# Patient Record
Sex: Female | Born: 1960 | Race: White | Hispanic: No | Smoking: Never smoker
Health system: Southern US, Community
[De-identification: ages and names within clinical notes are randomized; demographics above are authoritative.]

## PROBLEM LIST (undated history)

## (undated) DIAGNOSIS — K219 Gastro-esophageal reflux disease without esophagitis: Secondary | ICD-10-CM

## (undated) DIAGNOSIS — I10 Essential (primary) hypertension: Secondary | ICD-10-CM

## (undated) DIAGNOSIS — F32A Depression, unspecified: Secondary | ICD-10-CM

## (undated) DIAGNOSIS — K449 Diaphragmatic hernia without obstruction or gangrene: Secondary | ICD-10-CM

## (undated) DIAGNOSIS — E785 Hyperlipidemia, unspecified: Secondary | ICD-10-CM

## (undated) DIAGNOSIS — F419 Anxiety disorder, unspecified: Secondary | ICD-10-CM

## (undated) DIAGNOSIS — F329 Major depressive disorder, single episode, unspecified: Secondary | ICD-10-CM

## (undated) DIAGNOSIS — K5790 Diverticulosis of intestine, part unspecified, without perforation or abscess without bleeding: Secondary | ICD-10-CM

## (undated) HISTORY — PX: NASAL SINUS SURGERY: SHX719

## (undated) HISTORY — PX: TOTAL ABDOMINAL HYSTERECTOMY: SHX209

---

## 1997-07-28 ENCOUNTER — Encounter: Admission: RE | Admit: 1997-07-28 | Discharge: 1997-07-28 | Payer: Self-pay | Admitting: Sports Medicine

## 1998-12-08 ENCOUNTER — Encounter (INDEPENDENT_AMBULATORY_CARE_PROVIDER_SITE_OTHER): Payer: Self-pay | Admitting: Specialist

## 1998-12-08 ENCOUNTER — Other Ambulatory Visit: Admission: RE | Admit: 1998-12-08 | Discharge: 1998-12-08 | Payer: Self-pay | Admitting: Obstetrics and Gynecology

## 1999-02-22 ENCOUNTER — Other Ambulatory Visit: Admission: RE | Admit: 1999-02-22 | Discharge: 1999-02-22 | Payer: Self-pay | Admitting: Obstetrics and Gynecology

## 1999-03-29 ENCOUNTER — Inpatient Hospital Stay (HOSPITAL_COMMUNITY): Admission: RE | Admit: 1999-03-29 | Discharge: 1999-03-31 | Payer: Self-pay | Admitting: Obstetrics and Gynecology

## 1999-03-29 ENCOUNTER — Encounter (INDEPENDENT_AMBULATORY_CARE_PROVIDER_SITE_OTHER): Payer: Self-pay | Admitting: Specialist

## 2000-08-28 ENCOUNTER — Other Ambulatory Visit: Admission: RE | Admit: 2000-08-28 | Discharge: 2000-08-28 | Payer: Self-pay | Admitting: Obstetrics and Gynecology

## 2001-09-10 ENCOUNTER — Other Ambulatory Visit: Admission: RE | Admit: 2001-09-10 | Discharge: 2001-09-10 | Payer: Self-pay | Admitting: Obstetrics and Gynecology

## 2002-11-04 ENCOUNTER — Other Ambulatory Visit: Admission: RE | Admit: 2002-11-04 | Discharge: 2002-11-04 | Payer: Self-pay | Admitting: Obstetrics and Gynecology

## 2003-11-17 ENCOUNTER — Other Ambulatory Visit: Admission: RE | Admit: 2003-11-17 | Discharge: 2003-11-17 | Payer: Self-pay | Admitting: Obstetrics and Gynecology

## 2004-11-15 ENCOUNTER — Encounter: Admission: RE | Admit: 2004-11-15 | Discharge: 2004-11-15 | Payer: Self-pay | Admitting: Family Medicine

## 2005-04-18 ENCOUNTER — Other Ambulatory Visit: Admission: RE | Admit: 2005-04-18 | Discharge: 2005-04-18 | Payer: Self-pay | Admitting: Obstetrics and Gynecology

## 2008-10-06 ENCOUNTER — Encounter: Admission: RE | Admit: 2008-10-06 | Discharge: 2008-10-06 | Payer: Self-pay | Admitting: Obstetrics & Gynecology

## 2009-05-19 ENCOUNTER — Encounter: Payer: Self-pay | Admitting: Internal Medicine

## 2009-05-21 ENCOUNTER — Encounter: Payer: Self-pay | Admitting: Internal Medicine

## 2009-05-25 ENCOUNTER — Encounter: Payer: Self-pay | Admitting: Internal Medicine

## 2009-06-01 ENCOUNTER — Encounter: Payer: Self-pay | Admitting: Internal Medicine

## 2009-06-08 ENCOUNTER — Encounter: Payer: Self-pay | Admitting: Internal Medicine

## 2009-06-09 ENCOUNTER — Encounter: Payer: Self-pay | Admitting: Internal Medicine

## 2009-06-11 ENCOUNTER — Encounter: Payer: Self-pay | Admitting: Internal Medicine

## 2009-06-15 ENCOUNTER — Encounter: Payer: Self-pay | Admitting: Internal Medicine

## 2009-06-22 ENCOUNTER — Encounter: Payer: Self-pay | Admitting: Internal Medicine

## 2009-06-25 ENCOUNTER — Encounter: Payer: Self-pay | Admitting: Internal Medicine

## 2009-11-02 ENCOUNTER — Encounter: Payer: Self-pay | Admitting: Internal Medicine

## 2009-11-09 ENCOUNTER — Encounter: Payer: Self-pay | Admitting: Internal Medicine

## 2009-12-07 ENCOUNTER — Encounter: Payer: Self-pay | Admitting: Internal Medicine

## 2009-12-21 ENCOUNTER — Encounter: Payer: Self-pay | Admitting: Internal Medicine

## 2009-12-22 ENCOUNTER — Encounter: Payer: Self-pay | Admitting: Internal Medicine

## 2009-12-23 ENCOUNTER — Emergency Department (HOSPITAL_COMMUNITY): Admission: EM | Admit: 2009-12-23 | Discharge: 2009-12-23 | Payer: Self-pay | Admitting: Emergency Medicine

## 2009-12-23 ENCOUNTER — Encounter: Payer: Self-pay | Admitting: Internal Medicine

## 2009-12-24 ENCOUNTER — Encounter: Payer: Self-pay | Admitting: Internal Medicine

## 2009-12-28 ENCOUNTER — Encounter: Payer: Self-pay | Admitting: Internal Medicine

## 2009-12-30 ENCOUNTER — Encounter: Payer: Self-pay | Admitting: Internal Medicine

## 2010-01-27 ENCOUNTER — Telehealth: Payer: Self-pay | Admitting: Internal Medicine

## 2010-01-27 ENCOUNTER — Encounter (INDEPENDENT_AMBULATORY_CARE_PROVIDER_SITE_OTHER): Payer: Self-pay | Admitting: *Deleted

## 2010-02-01 ENCOUNTER — Telehealth: Payer: Self-pay | Admitting: Internal Medicine

## 2010-02-22 ENCOUNTER — Ambulatory Visit: Payer: Self-pay | Admitting: Internal Medicine

## 2010-02-22 DIAGNOSIS — K625 Hemorrhage of anus and rectum: Secondary | ICD-10-CM

## 2010-02-22 DIAGNOSIS — R748 Abnormal levels of other serum enzymes: Secondary | ICD-10-CM | POA: Insufficient documentation

## 2010-02-22 DIAGNOSIS — K219 Gastro-esophageal reflux disease without esophagitis: Secondary | ICD-10-CM | POA: Insufficient documentation

## 2010-02-22 DIAGNOSIS — K5900 Constipation, unspecified: Secondary | ICD-10-CM | POA: Insufficient documentation

## 2010-02-22 DIAGNOSIS — K2270 Barrett's esophagus without dysplasia: Secondary | ICD-10-CM

## 2010-02-22 DIAGNOSIS — R1319 Other dysphagia: Secondary | ICD-10-CM

## 2010-02-22 DIAGNOSIS — R1012 Left upper quadrant pain: Secondary | ICD-10-CM

## 2010-02-25 ENCOUNTER — Ambulatory Visit (HOSPITAL_COMMUNITY)
Admission: RE | Admit: 2010-02-25 | Discharge: 2010-02-25 | Payer: Self-pay | Source: Home / Self Care | Attending: Internal Medicine | Admitting: Internal Medicine

## 2010-02-25 ENCOUNTER — Telehealth: Payer: Self-pay | Admitting: Internal Medicine

## 2010-03-01 ENCOUNTER — Encounter (INDEPENDENT_AMBULATORY_CARE_PROVIDER_SITE_OTHER): Payer: Self-pay | Admitting: *Deleted

## 2010-03-04 ENCOUNTER — Encounter (INDEPENDENT_AMBULATORY_CARE_PROVIDER_SITE_OTHER): Payer: Self-pay | Admitting: *Deleted

## 2010-03-08 ENCOUNTER — Ambulatory Visit: Payer: Self-pay | Admitting: Internal Medicine

## 2010-03-10 ENCOUNTER — Telehealth (INDEPENDENT_AMBULATORY_CARE_PROVIDER_SITE_OTHER): Payer: Self-pay | Admitting: *Deleted

## 2010-03-23 ENCOUNTER — Ambulatory Visit
Admission: RE | Admit: 2010-03-23 | Discharge: 2010-03-23 | Payer: Self-pay | Source: Home / Self Care | Attending: Internal Medicine | Admitting: Internal Medicine

## 2010-03-23 ENCOUNTER — Encounter: Payer: Self-pay | Admitting: Internal Medicine

## 2010-03-24 ENCOUNTER — Telehealth: Payer: Self-pay | Admitting: Internal Medicine

## 2010-03-26 ENCOUNTER — Telehealth: Payer: Self-pay | Admitting: Internal Medicine

## 2010-03-30 ENCOUNTER — Telehealth: Payer: Self-pay | Admitting: Internal Medicine

## 2010-04-06 ENCOUNTER — Ambulatory Visit (HOSPITAL_COMMUNITY)
Admission: RE | Admit: 2010-04-06 | Discharge: 2010-04-06 | Payer: Self-pay | Source: Home / Self Care | Attending: Internal Medicine | Admitting: Internal Medicine

## 2010-04-10 ENCOUNTER — Encounter: Payer: Self-pay | Admitting: Obstetrics and Gynecology

## 2010-04-19 ENCOUNTER — Ambulatory Visit
Admission: RE | Admit: 2010-04-19 | Discharge: 2010-04-19 | Payer: Self-pay | Source: Home / Self Care | Attending: Internal Medicine | Admitting: Internal Medicine

## 2010-04-19 DIAGNOSIS — K449 Diaphragmatic hernia without obstruction or gangrene: Secondary | ICD-10-CM | POA: Insufficient documentation

## 2010-04-19 DIAGNOSIS — K3184 Gastroparesis: Secondary | ICD-10-CM | POA: Insufficient documentation

## 2010-04-19 DIAGNOSIS — K5730 Diverticulosis of large intestine without perforation or abscess without bleeding: Secondary | ICD-10-CM | POA: Insufficient documentation

## 2010-04-20 ENCOUNTER — Telehealth: Payer: Self-pay | Admitting: Internal Medicine

## 2010-04-20 NOTE — Progress Notes (Signed)
Summary: New Patient Appt/Change of Practice  Phone Note Outgoing Call   Summary of Call: I spoke to pt and advised her we did find her records from [facility] and Dr. Carlean Purl will accept her as a patient.  I told the pt her records were on my desk when we had a water leak and everything on my desk was quickly picked up and placed in boxes.  Pt is understanding and thankful that we found her records.  She is scheduled for 03/17/10 @ 11am.  I advised pt she is scheduled a bit farther out than usual as we are scheduling her with extra time.  Pt is appreciative that we are going to spend extra time with her.  New patient letter printed and mailed to patient.  She will call in the interim if she begins to have any problems. Initial call taken by: Abelino Derrick CMA Deborra Medina),  January 27, 2010 9:21 AM

## 2010-04-20 NOTE — Progress Notes (Signed)
Summary: UGI shows HH, schedule EGD/colon  Phone Note Outgoing Call   Summary of Call: Let her know the UGI shows a hiatal hernia it is possible that is cause of her problems 1) EGD for LUQ pain and Barrett's 2) colonoscopy for rectal bleeding  am willing to do 12/14 afternoon if possible at [facility] otherwise looks like January unless she wants to separate the 2 and hold on colonoscopy (think EGD most important right now but will need a colonoscopy) Gatha Mayer MD, [facility]  February 25, 2010 5:53 PM   Follow-up for Phone Call        I spoke with pt.  She will check calendar and call me back regarding Wednesday.  Per Sharee Pimple at [facility]--OK to do procedure at 2:45P on 12/14. Abelino Derrick CMA Deborra Medina)  February 26, 2010 4:23 PM   RC from pt.  She will have procedures on 12/14.  This is set up at [facility].  She will come by office on 12/12 for instructions. Follow-up by: Abelino Derrick CMA Deborra Medina),  February 26, 2010 4:32 PM     Appended Document: UGI shows HH, schedule EGD/colon    Clinical Lists Changes  Medications: Added new medication of MOVIPREP 100 GM  SOLR (PEG-KCL-NACL-NASULF-NA ASC-C) As per prep instructions. - Signed Rx of MOVIPREP 100 GM  SOLR (PEG-KCL-NACL-NASULF-NA ASC-C) As per prep instructions.;  #1 x 0;  Signed;  Entered by: Abelino Derrick CMA (AAMA);  Authorized by: Gatha Mayer MD, [facility];  Method used: Electronically to [facility]*, [address] [place], [place]  [id number], Ph: [phone], Fax: [phone]    Prescriptions: MOVIPREP 100 GM  SOLR (PEG-KCL-NACL-NASULF-NA ASC-C) As per prep instructions.  #1 x 0   Entered by:   Abelino Derrick CMA ([place])   Authorized by:   Gatha Mayer MD, [facility]   Signed by:   Abelino Derrick CMA ([place]) on 02/26/2010   Method used:   Electronically to        [facility] (retail)       [place], [place]  [id number]       Ph: [phone]       Fax: [phone]   RxID:    [id number]

## 2010-04-20 NOTE — Progress Notes (Signed)
Summary: sooner appt  Phone Note Call from Patient Call back at Home Phone [phone] Call back at [phone]   Summary of Call: Message was left on my voice mail, pt would like to have a sooner appt.  She continues to have abd pain and GERD symptoms.  As we have not seen her in the office can we give her medical advice?  I have checked your schedule and there are no sooner appt times. Initial call taken by: Abelino Derrick CMA Deborra Medina),  February 01, 2010 5:24 PM  Follow-up for Phone Call        Ask Remo Lipps if we can open up some slots (my approval only - or you or Sheri) for afternoon Dec 5  looks like I need to work that PM Gatha Mayer MD, [facility]  February 01, 2010 6:45 PM  Remo Lipps will open some slots.  Pt is scheduled for 1:30pm, advised to arrive at 1:15pm.  Advised pt we could not give her specific advice, but she should continue managing her symptoms as she is.  If severe pain of fever to go to the ED.  pt voices understanding. Follow-up by: Abelino Derrick CMA Deborra Medina),  February 02, 2010 9:58 AM

## 2010-04-20 NOTE — Letter (Signed)
Summary: New Patient letter  [facility]  [address] [place], [place] [address]   Phone: [phone]  Fax: [phone]       01/27/2010 MRN: [id number]  Meagan Harmon [address] [place], [place]  [address]  Dear Meagan Harmon,  Welcome to the Gastroenterology Division at [facility].    You are scheduled to see Dr.  Carlean Purl on 03/17/10 at 11:00 AM on the 3rd floor at [facility] [place] [employer] [facility].  We ask that you try to arrive at our office 15 minutes prior to your appointment time to allow for check-in.  We would like you to complete the enclosed self-administered evaluation form prior to your visit and bring it with you on the day of your appointment.  We will review it with you.  Also, please bring a complete list of all your medications or, if you prefer, bring the medication bottles and we will list them.  Please bring your insurance card so that we may make a copy of it.  If your insurance requires a referral to see a specialist, please bring your referral form from your primary care physician.  Co-payments are due at the time of your visit and may be paid by cash, check or credit card.     Your office visit will consist of a consult with your physician (includes a physical exam), any laboratory testing he/she may order, scheduling of any necessary diagnostic testing (e.g. x-ray, ultrasound, CT-scan), and scheduling of a procedure (e.g. Endoscopy, Colonoscopy) if required.  Please allow enough time on your schedule to allow for any/all of these possibilities.    If you cannot keep your appointment, please call [phone] to cancel or reschedule prior to your appointment date.  This allows [place] the opportunity to schedule an appointment for another patient in need of care.  If you do not cancel or reschedule by 5 p.m. the business day prior to your appointment date, you will be charged a $50.00 late cancellation/no-show fee.    Thank you for  choosing [facility] for your medical needs.  We appreciate the opportunity to care for you.  Please visit [place] at our website  to learn more about our practice.                     Sincerely,                                                             The Gastroenterology Division

## 2010-04-20 NOTE — Assessment & Plan Note (Signed)
Summary: SEVERE INDIGESTION//OK TO SCHEDULE PER DR. GESSNER//SP   History of Present Illness Visit Type: new patient  Primary GI MD: Silvano Rusk MD [facility] Primary Provider: [facility]) Requesting Provider: na Chief Complaint: GERD History of Present Illness:   50 yo ww with approximately 2 year of indigestion problems. She was diagnosed with GERD and Barrett's esophagus after an EGD by Dr. Ferdinand Lango. Initially did well with therapy (PPI). However after that and 6 months she began to have LUQ pain and pain into the back. Tis ocures with or without eating. She stopped the PPI 's because they were ineffective and she lost confidence in GI care. Off x 8 weeks, using Alka Seltzer. She gets relief with this.  Pyrosis with retrosternal burning. She also had a nocturnal cough and choking. She will get hiccoughs when she eats and she stands up and belches and feels better but cannot eat anyoe (3 years of this). she may have to walk around to relieve it. Sometimes she vomits (projectile).  Some early satiety also. Bloated.  Amylase level slightly elevated and cardiologist ?ed possibility of pancreatitis  Also complaining of some painful defecation and slight rectal bleeding at times.    GI Review of Systems    Reports abdominal pain, acid reflux, belching, bloating, chest pain, dysphagia with solids, heartburn, loss of appetite, nausea, and  vomiting.     Location of  Abdominal pain: generalized.    Denies dysphagia with liquids, vomiting blood, weight loss, and  weight gain.      Reports change in bowel habits, constipation, diarrhea, rectal bleeding, and  rectal pain.     Denies anal fissure, black tarry stools, diverticulosis, fecal incontinence, heme positive stool, hemorrhoids, irritable bowel syndrome, jaundice, light color stool, and  liver problems. Preventive Screening-Counseling & Management  Alcohol-Tobacco     Alcohol drinks/day: 0     Smoking Status:  never  Caffeine-Diet-Exercise     Caffeine use/day: 5     Caffeine Counseling: decrease use of caffeine  Comments: she is reducing caffeine    HIDA Scan  Procedure date:  12/28/2009  Findings:      Normal with EF 43 %  [place] of Abdomen  Procedure date:  12/22/2009  Findings:      [place]  [place] of Abdomen  Procedure date:  06/09/2009  Findings:      Normal  EGD  Procedure date:  06/01/2009  Findings:      Barrett's esophagus Esophagitis  Dr. Ferdinand Lango   CT Abdomen/Pelvis  Procedure date:  06/25/2009  Findings:      Normal  [place]  CT Abdomen/Pelvis  Procedure date:  12/23/2009  Findings:        A small hiatal hernia with mild circumferential wall thickening in   the distal esophagus.  This may be related to esophagitis although   the entire extent of the abnormal esophagus is not visualized on   today's study and esophageal neoplasm would also be a   consideration.    Fluid is visible in the rectum, suggesting diarrhea.  No evidence   for colonic wall thickening to suggest colitis.   Current Medications (verified): 1)  Lisinopril-Hydrochlorothiazide 20-25 Mg Tabs (Lisinopril-Hydrochlorothiazide) .... Take 1 Tablet By Mouth Once A Day 2)  Zoloft 100 Mg Tabs (Sertraline Hcl) .... Take 2 Tablet By Mouth Daily 3)  Tylenol Extra Strength 500 Mg Tabs (Acetaminophen) .... Take 1 Tablet By Mouth As Needed 4)  Astepro 0.15 % Soln (Azelastine Hcl) .Marland KitchenMarland KitchenMarland Kitchen  As Needed 5)  Xyzal 5 Mg Tabs (Levocetirizine Dihydrochloride) .... Take 1 Tablet By Mouth Once A Day 6)  Alka-Seltzer Antacid 500 Mg Caps (Calcium Carbonate Antacid) .... As Needed 7)  Shaklee Herbal Laxative .[name].. 4 By Mouth Each Night  Allergies (verified): 1)  Sulfa  Past History:  Past Medical History: Depression GERD, ? Barrett's Hyperlipidemia Hypertension Anxiety Disorder Chronic Headaches  Past Surgical History: Hysterectomy Sinus Surgery   Family History: Family History  of Heart Disease: Mother No FH of Colon Cancer:  Social History: Dog Groomer Married 2 Children Patient has never smoked.  Illicit Drug Use - no Daily Caffeine Use: 5 daily  Alcohol Use - no Caffeine use/day:  5 Alcohol drinks/day:  0  Review of Systems       The patient complains of allergy/sinus, anxiety-new, arthritis/joint pain, back pain, change in vision, cough, depression-new, fatigue, headaches-new, night sweats, sleeping problems, and swelling of feet/legs.         All other ROS negative except as per HPI.   Vital Signs:  Patient profile:   50 year old female Height:      65 inches Weight:      151 pounds BMI:     25.22 BSA:     1.76 Pulse rate:   80 / minute Pulse rhythm:   regular BP sitting:   106 / 70  (left arm) Cuff size:   regular  Vitals Entered By: Hope Pigeon CMA (February 22, 2010 1:47 PM)  Physical Exam  General:  Well developed, well nourished, no acute distress. Head:  Normocephalic and atraumatic. Eyes:  PERRLA, no icterus. Mouth:  No deformity or lesions, dentition normal. Neck:  Supple; no masses or thyromegaly. Lungs:  Clear throughout to auscultation. Heart:  Regular rate and rhythm; no murmurs, rubs,  or bruits. Abdomen:  mild epigastric tenderness no masses, herniae, HSM BS+ no splash no bruits Rectal:  female staff present normal anoderm, small rectocele, brown stool, no masses ANOSCOPY: hemorhoids n anal canal Extremities:  no edema Skin:  numerous tattoos Cervical Nodes:  No significant cervical or supraclavicular adenopathy.  Psych:  Alert and cooperative. Normal mood and affect.   CMET, CBC, Lipase normal on 12/21/09 Amylase 144 high (22-80_ A1c = 5.5 Mag 1.9 TSH Normal 1.720 (11/09/09) Extensive office notes reviewed also as well as pulmonary and cardiac studies to be scanned into chart  Impression & Recommendations:  Problem # 1:  LUQ PAIN (ICD-789.02) Assessment New Cause not clear Objective data is esophagitis  and apparent Barrett's esophagus, hiatal hernia. ? if there is a motility disturbance or other functional pain. She is going to need a repeat EGD but Ba study first to asess anatomy and motility. Note PPI's (multiple) have not seemed to help, currently using alka Seltzer  Orders: UGI Series (UGI Series)  Problem # 2:  OTHER DYSPHAGIA (ICD-787.29) Assessment: New ?acahalasia or other motility disturbance   Orders: UGI Series (UGI Series)  Problem # 3:  GERD (ICD-530.81) Assessment: New Esophagitis at EGD and recent CT suggests also  Problem # 4:  BARRETTS ESOPHAGUS (ICD-530.85) will need an EGD by March  Problem # 5:  RECTAL BLEEDING (ICD-569.3) hemorhoids seen on anoscopy and pattern very consistent with this (only red blood on the tissue). to add fiber to herb laxatives  Problem # 6:  HYPERAMYLASEMIA (ICD-790.5) Assessment: New Mild elevations with normal lipase likely inconsequential ? macroamylasemia  Patient Instructions: 1)  You have an Upper GI Series scheduled for 02/25/10.  See seperate instructions. 2)  We will call you with further follow up after reviewing these results.  3)  Please begin taking Benefiber daily as prescribed below. 4)  Copy sent to : [facility] 5)  The medication list was reviewed and reconciled.  All changed / newly prescribed medications were explained.  A complete medication list was provided to the patient / caregiver.

## 2010-04-22 NOTE — Progress Notes (Signed)
Summary: biopsy results and plans  Phone Note Outgoing Call   Summary of Call: Let her know 1) Reflux esophagitis but no Barrett's esophagus seen this time - will need a repeat EGD within 1 year 2) proctitis - inflamed rectum - repeat routine colonoscopy x 1 year  3) If she is having constipation problems I recommend daily MiraLax 4) Hydrocortisone suppositoryt 25 milligrams per rectum nightly for 7 nights then as needed for rectal bleeding #21 no refills 5) schedule gastric emptying study re: refractory reflux disease 6) REV in February 7) continue with treatment plan and changes as per EGD note Gatha Mayer MD, [facility]  March 26, 2010 11:09 AM   Follow-up for Phone Call        Left message for patient to call back Patient is scheduled for GES 04/06/10 10:00 at [place] scheduled 05/04/10 8:30 RX to her pharmacy Barb Merino RN, [facility]  March 26, 2010 3:40 PM  Additional Follow-up for Phone Call Additional follow up Details #1::        repeat colonoscopy is 10 years Gatha Mayer MD, [facility]  March 26, 2010 3:58 PM     Additional Follow-up for Phone Call Additional follow up Details #2::    Left message for patient to call back Barb Merino RN, CGRN  March 26, 2010 5:05 PM  patient notified of all the above.  Recalls entered in [place] as indicated. Follow-up by: Barb Merino RN, CGRN,  March 29, 2010 10:50 AM  New/Updated Medications: ANUSOL-HC 25 MG SUPP (HYDROCORTISONE ACETATE) 1 pr q hs for 7 days Prescriptions: ANUSOL-HC 25 MG SUPP (HYDROCORTISONE ACETATE) 1 pr q hs for 7 days  #21 x 0   Entered by:   Barb Merino RN, CGRN   Authorized by:   Gatha Mayer MD, [facility]   Signed by:   Barb Merino RN, CGRN on 03/26/2010   Method used:   Electronically to        [facility]* (retail)       [address] [place], [place]  [id number]       Ph: [phone]       Fax: [phone]   RxID:   [id number]

## 2010-04-22 NOTE — Procedures (Signed)
Summary: Upper Endoscopy  Patient: Meagan Harmon Note: All result statuses are Final unless otherwise noted.  Tests: (1) Upper Endoscopy (EGD)   EGD Upper Endoscopy       [facility].     [place], [place]  [address]           ENDOSCOPY PROCEDURE REPORT           PATIENT:  Meagan Harmon, Meagan Harmon  MR#:  [id number]     BIRTHDATE:  [birth year], 49 yrs. old  GENDER:  female           ENDOSCOPIST:  Gatha Mayer, MD, [facility]           PROCEDURE DATE:  03/23/2010     PROCEDURE:  EGD with biopsy, [id number]     ASA CLASS:  Class II     INDICATIONS:  abdominal pain, left upper quadrant prior diagnosis     of Barrett's esophagus     also has heartburn/reflux           MEDICATIONS:   Fentanyl 50 mcg IV, Versed 7.5 mg IV     TOPICAL ANESTHETIC:  Exactacain Spray           DESCRIPTION OF PROCEDURE:   After the risks benefits and     alternatives of the procedure were thoroughly explained, informed     consent was obtained.  The [facility] [id number] endoscope was     introduced through the mouth and advanced to the second portion of     the duodenum, without limitations.  The instrument was slowly     withdrawn as the mucosa was fully examined.     <<PROCEDUREIMAGES>>           Multiple ulcers were found in the distal esophagus. Liner     ulcers/erosions 30-35 cm. Multiple biopsies were obtained and sent     to pathology.  A hiatal hernia was found. It was 5 cm in size.     35-40 cm.  Otherwise the examination was normal.    Retroflexed     views revealed a hiatal hernia.    The scope was then withdrawn     from the patient and the procedure completed.           COMPLICATIONS:  None           ENDOSCOPIC IMPRESSION:     1) Ulcers, multiple in the distal esophagus - consistent with     reflux esophagitis     2) 5 cm hiatal hernia     3) Otherwise normal examination           RECOMMENDATIONS:     1) REDUCE AND TRY TO STOP CAFFEINE     2) START RANITIDINE 300  MG TWICE A DAY (she reports that PPI     therapy was ineffective)     3) STOP ALKA SELTZER     4) WILL NOTIFY RE: PATHOLOGY AND NEXT STEP.           REPEAT EXAM:  In for EGD, pending biopsy results.           Gatha Mayer, MD, [facility]           CC:  The Patient           n.     eSIGNED:   Gatha Mayer at 03/23/2010 04:03 PM  [place], [place], [id number]  Note: An exclamation mark (!) indicates a result that was not dispersed into the flowsheet. Document Creation Date: 03/23/2010 4:04 PM _______________________________________________________________________  (1) Order result status: Final Collection or observation date-time: 03/23/2010 15:24 Requested date-time:  Receipt date-time:  Reported date-time:  Referring Physician:   Ordering Physician: Silvano Rusk [phone]) Specimen Source:  Source: Tawanna Cooler Order Number: [id number] Lab site:   Appended Document: Upper Endoscopy     Procedures Next Due Date:    EGD: 03/2011

## 2010-04-22 NOTE — Letter (Signed)
Summary: Pre Visit Letter Revised  [facility]  [place], [place] [address]   Phone: [phone]  Fax: [phone]        03/01/2010 MRN: [id number] Meagan Harmon [address] [place], [place]  [address]             Procedure Date:  March 23, 2010  Welcome to the Gastroenterology Division at [facility].    You are scheduled to see a nurse for your pre-procedure visit on March 08, 2010 at 8:00 am on the 3rd floor at [facility], [place] [employer].  We ask that you try to arrive at our office 15 minutes prior to your appointment time to allow for check-in.  Please take a minute to review the attached form.  If you answer "Yes" to one or more of the questions on the first page, we ask that you call the person listed at your earliest opportunity.  If you answer "No" to all of the questions, please complete the rest of the form and bring it to your appointment.    Your nurse visit will consist of discussing your medical and surgical history, your immediate family medical history, and your medications.   If you are unable to list all of your medications on the form, please bring the medication bottles to your appointment and we will list them.  We will need to be aware of both prescribed and over the counter drugs.  We will need to know exact dosage information as well.    Please be prepared to read and sign documents such as consent forms, a financial agreement, and acknowledgement forms.  If necessary, and with your consent, a friend or relative is welcome to sit-in on the nurse visit with you.  Please bring your insurance card so that we may make a copy of it.  If your insurance requires a referral to see a specialist, please bring your referral form from your primary care physician.  No co-pay is required for this nurse visit.     If you cannot keep your appointment, please call [phone] to cancel or reschedule prior to your appointment  date.  This allows [place] the opportunity to schedule an appointment for another patient in need of care.    Thank you for choosing [facility] for your medical needs.  We appreciate the opportunity to care for you.  Please visit [place] at our website  to learn more about our practice.  Sincerely, The Gastroenterology Division

## 2010-04-22 NOTE — Progress Notes (Signed)
Summary: Question about procedure  Phone Note Call from Patient Call back at Work Phone [phone]   Call For: Dr Carlean Purl Summary of Call: Having a ECL on 03-23-2010 and has a question about the procedure. Initial call taken by: Irwin Brakeman [facility],  March 10, 2010 9:48 AM  Follow-up for Phone Call        tried to reach pt. line busy x 2  Ulice Dash RN  March 10, 2010 10:00 AM  Pt called to tell us that she has a paper that says what type of anesthesia med she cannot take.  Her husband will bring paper to office on Friday 12/23.  Ulice Dash RN  March 10, 2010 10:31 AM

## 2010-04-22 NOTE — Letter (Signed)
Summary: [facility]   Imported By: Phillis Knack 03/01/2010 12:23:59  _____________________________________________________________________  External Attachment:    Type:   Image     Comment:   External Document

## 2010-04-22 NOTE — Letter (Signed)
Summary: [facility]   Imported By: Phillis Knack 03/01/2010 12:22:58  _____________________________________________________________________  External Attachment:    Type:   Image     Comment:   External Document

## 2010-04-22 NOTE — Progress Notes (Signed)
Summary: List of P.P.I. drugs.  Phone Note Call from Patient Call back at Work Phone [phone]   Caller: Patient Call For: Dr. Carlean Purl Reason for Call: Talk to Nurse Summary of Call: Wants to know if she can take OTC anti-acids for her acid reflux Initial call taken by: Webb Laws,  March 30, 2010 2:33 PM  Follow-up for Phone Call        Zantac is not controlling her heartburn she is requesting an alternate medication. Dr Carlean Purl please advise Follow-up by: Barb Merino RN, [place],  March 30, 2010 4:14 PM  Additional Follow-up for Phone Call Additional follow up Details #1::        we need to know every PPI she has taken and said di not work - my office note may have some listed but i would like her to try to list them, we may need to call them out to her when we find what is not on that list will try that one Additional Follow-up by: Gatha Mayer MD, Marval Regal,  March 31, 2010 6:15 AM    Additional Follow-up for Phone Call Additional follow up Details #2::    Pt. has previously tried Nexium and Prilosec once a day and was on Aciphex b.i.d. which she said helped  for awhile but then she  started getting breakthough  symptoms  even on b.i.d. dose  Follow-up by: Abel Presto RN,  March 31, 2010 9:30 AM  Additional Follow-up for Phone Call Additional follow up Details #3:: Details for Additional Follow-up Action Taken: see pantoprazole rx she can stop ranitidine on this Pt. ntfd. that Dr.Kanika Bungert sent new rx. to pharmacy and she is to stop Rantidine. Additional Follow-up by: Abel Presto RN,  March 31, 2010 2:00 PM  New/Updated Medications: PANTOPRAZOLE SODIUM 40 MG TBEC (PANTOPRAZOLE SODIUM) 1 by mouth two times a day take 30 minutes before breakfast and 30 minutes before supper Prescriptions: PANTOPRAZOLE SODIUM 40 MG TBEC (PANTOPRAZOLE SODIUM) 1 by mouth two times a day take 30 minutes before breakfast and 30 minutes before supper  #60 x 5   Entered and Authorized by:    Gatha Mayer MD, [facility]   Signed by:   Gatha Mayer MD, FACG on 03/31/2010   Method used:   Electronically to        [facility] (retail)       [address] [place], [place]  [id number]       Ph: [phone]       Fax: [phone]   RxID:   [id number]

## 2010-04-22 NOTE — Letter (Signed)
Summary: [facility] Instructions  [facility]  [place], [place] [address]   Phone: [phone]  Fax: [phone]       [name]    [birth year]    MRN: [id number]        Procedure Day /Date:  03/23/10  Tuesday     Arrival Time:  2pm      Procedure Time:  3pm     Location of Procedure:                    _x _  [place] (4th Floor)   [place]   Starting 5 days prior to your procedure 03/18/10  do not eat nuts, seeds, popcorn, corn, beans, peas,  salads, or any raw vegetables.  Do not take any fiber supplements (e.g. Metamucil, Citrucel, and Benefiber).  THE DAY BEFORE YOUR PROCEDURE         DATE:  03/22/10  DAY:   Monday  1.  Drink clear liquids the entire day-NO SOLID FOOD  2.  Do not drink anything colored red or purple.  Avoid juices with pulp.  No orange juice.  3.  Drink at least 64 oz. (8 glasses) of fluid/clear liquids during the day to prevent dehydration and help the prep work efficiently.  CLEAR LIQUIDS INCLUDE: Water Jello Ice Popsicles Tea (sugar ok, no milk/cream) Powdered fruit flavored drinks Coffee (sugar ok, no milk/cream) Gatorade Juice: apple, white grape, white cranberry  Lemonade Clear bullion, consomm, broth Carbonated beverages (any kind) Strained chicken noodle soup Hard Candy                             4.  In the morning, mix first dose of MoviPrep solution:    Empty 1 Pouch A and 1 Pouch B into the disposable container    Add lukewarm drinking water to the top line of the container. Mix to dissolve    Refrigerate (mixed solution should be used within 24 hrs)  5.  Begin drinking the prep at 5:00 p.m. The MoviPrep container is divided by 4 marks.   Every 15 minutes drink the solution down to the next mark (approximately 8 oz) until the full liter is complete.   6.  Follow completed prep with 16 oz of clear liquid of your choice (Nothing red or purple).   Continue to drink clear liquids until bedtime.  7.  Before going to bed, mix second dose of MoviPrep solution:    Empty 1 Pouch A and 1 Pouch B into the disposable container    Add lukewarm drinking water to the top line of the container. Mix to dissolve    Refrigerate  THE DAY OF YOUR PROCEDURE      DATE:   03/23/10    DAY:   Tuesday  Beginning at  10:00 a.m. (5 hours before procedure):         1. Every 15 minutes, drink the solution down to the next mark (approx 8 oz) until the full liter is complete.  2. Follow completed prep with 16 oz. of clear liquid of your choice.    3. You may drink clear liquids until  1:00pm  (2 HOURS BEFORE PROCEDURE).   MEDICATION INSTRUCTIONS  Unless otherwise instructed, you should take regular prescription medications with a small sip of water   as early as possible the morning of your procedure.  Additional medication instructions:  Hold Lisinopril/HCTZ the morning of procedure.         OTHER INSTRUCTIONS  You will need a responsible adult at least 49 years of age to accompany you and drive you home.   This person must remain in the waiting room during your procedure.  Wear loose fitting clothing that is easily removed.  Leave jewelry and other valuables at home.  However, you may wish to bring a book to read or  an iPod/MP3 player to listen to music as you wait for your procedure to start.  Remove all body piercing jewelry and leave at home.  Total time from sign-in until discharge is approximately 2-3 hours.  You should go home directly after your procedure and rest.  You can resume normal activities the  day after your procedure.  The day of your procedure you should not:   Drive   Make legal decisions   Operate machinery   Drink alcohol   Return to work  You will receive specific instructions about eating, activities and medications before you leave.    The above instructions have been reviewed and explained to  me by   Emerson Monte RN  March 08, 2010 8:32 AM    I fully understand and can verbalize these instructions _____________________________ Date _________

## 2010-04-22 NOTE — Letter (Signed)
Summary: [facility]   Imported By: Phillis Knack 03/01/2010 12:26:58  _____________________________________________________________________  External Attachment:    Type:   Image     Comment:   External Document

## 2010-04-22 NOTE — Procedures (Signed)
Summary: Colonoscopy  Patient: Meagan Harmon Note: All result statuses are Final unless otherwise noted.  Tests: (1) Colonoscopy (COL)   COL Colonoscopy           [facility]     [place], [place]  [id number]           COLONOSCOPY PROCEDURE REPORT           PATIENT:  Meagan, Harmon  MR#:  [id number]     BIRTHDATE:  09-28-1960, 49 yrs. old  GENDER:  female     ENDOSCOPIST:  Gatha Mayer, MD, [facility]           PROCEDURE DATE:  03/23/2010     PROCEDURE:  Colonoscopy with biopsy     ASA CLASS:  Class II     INDICATIONS:  rectal bleeding     MEDICATIONS:   There was residual sedation effect present from     prior procedure., Fentanyl 25 mcg, Versed 2.5 mg IV           DESCRIPTION OF PROCEDURE:   After the risks benefits and     alternatives of the procedure were thoroughly explained, informed     consent was obtained.  Digital rectal exam was performed and     revealed no abnormalities.   The LB 180AL [id number] endoscope was     introduced through the anus and advanced to the cecum, which was     identified by both the appendix and ileocecal valve, without     limitations.  The quality of the prep was excellent, using     MoviPrep.  The instrument was then slowly withdrawn as the colon     was fully examined. Insertion: 2:56 minutes Withdrawal: 7:10     minutes     <<PROCEDUREIMAGES>>           FINDINGS:  Moderate diverticulosis was found in the left colon.     Abnormal appearing mucosa in the rectum. Area of fissured and     irregular mucosa in distal rectum with mucoid discharge - ?     proctitis. Multiple biopsies were obtained and sent to pathology.     This was otherwise a normal examination of the colon.   Retroflexed     views in the rectum revealed no abnormalities.    The scope was     then withdrawn from the patient and the procedure completed.           COMPLICATIONS:  None     ENDOSCOPIC IMPRESSION:     1) Moderate diverticulosis  in the left colon     2) Abnormal mucosa in the rectum - ? proctitis     3) Otherwise normal examination     RECOMMENDATIONS:     1) Await biopsy results     REPEAT EXAM:  In for Colonoscopy, pending biopsy results.           Gatha Mayer, MD, [facility] Regal           CC:  The Patient           n.     eSIGNED:   Gatha Mayer at 03/23/2010 04:07 PM           Alla German, [id number]  Note: An exclamation mark (!) indicates a result that was not dispersed into the flowsheet. Document Creation Date: 03/23/2010 4:08 PM _______________________________________________________________________  (1) Order result  status: Final Collection or observation date-time: 03/23/2010 15:37 Requested date-time:  Receipt date-time:  Reported date-time:  Referring Physician:   Ordering Physician: Silvano Rusk [phone]) Specimen Source:  Source: Tawanna Cooler Order Number: [id number] Lab site:   Appended Document: Colonoscopy     Procedures Next Due Date:    Colonoscopy: 03/2020

## 2010-04-22 NOTE — Progress Notes (Signed)
Summary: Procedure ?s  Phone Note Call from Patient Call back at Home Phone [phone]   Caller: Patient Call For: Dr.Gessner Reason for Call: Talk to Nurse Summary of Call: Pt wants to speak with nurse, she has questions about the procedure that she had Initial call taken by: Martinique Johnson,  March 24, 2010 10:50 AM  Follow-up for Phone Call        Question answered as to if Dr. Carlean Purl will call her about an appointment.  Per EGD report, Dr. Carlean Purl will notrify her of bx results and next step in tx.  No further questions Follow-up by: Thurston Pounds RN II,  March 24, 2010 11:02 AM

## 2010-04-22 NOTE — Procedures (Signed)
Summary: Upper GI Endoscopy/Lenny Ferdinand Lango MD  Upper GI Katherine Mantle MD   Imported By: Phillis Knack 03/01/2010 12:05:12  _____________________________________________________________________  External Attachment:    Type:   Image     Comment:   External Document

## 2010-04-22 NOTE — Miscellaneous (Signed)
Summary: ranitidine rx  Clinical Lists Changes  Medications: Removed medication of MOVIPREP 100 GM  SOLR (PEG-KCL-NACL-NASULF-NA ASC-C) As per prep instructions. Added new medication of RANITIDINE HCL 300 MG  TABS (RANITIDINE HCL) 1 by mouth two times a day - Signed Rx of RANITIDINE HCL 300 MG  TABS (RANITIDINE HCL) 1 by mouth two times a day;  #60 x 1;  Signed;  Entered by: Gatha Mayer MD, [facility] Regal;  Authorized by: Gatha Mayer MD, [facility];  Method used: Electronically to [facility] [address], [place], [place]  [id number], Ph: [phone], Fax: [phone]    Prescriptions: RANITIDINE HCL 300 MG  TABS (RANITIDINE HCL) 1 by mouth two times a day  #60 x 1   Entered and Authorized by:   Gatha Mayer MD, [facility]   Signed by:   Gatha Mayer MD, FACG on 03/23/2010   Method used:   Electronically to        [facility] (retail)       [place], [place]  [id number]       Ph: [phone]       Fax: [phone]   RxID:   [id number]

## 2010-04-22 NOTE — Letter (Signed)
Summary: [facility]   Imported By: Phillis Knack 03/01/2010 12:26:00  _____________________________________________________________________  External Attachment:    Type:   Image     Comment:   External Document

## 2010-04-22 NOTE — Miscellaneous (Signed)
Summary: [facility] Previsit/prep  Clinical Lists Changes  Observations: Added new observation of ALLERGY REV: Done (03/08/2010 8:04)

## 2010-04-22 NOTE — Letter (Signed)
Summary: [facility]   Imported By: Phillis Knack 03/01/2010 12:25:05  _____________________________________________________________________  External Attachment:    Type:   Image     Comment:   External Document

## 2010-04-28 NOTE — Assessment & Plan Note (Signed)
Summary: Follow up EGD/GES/sheri   History of Present Illness Visit Type: Follow-up Visit Primary GI MD: Silvano Rusk MD [facility] Primary Provider: [facility]) Requesting Provider: na Chief Complaint: Acid reflux  History of Present Illness:   50 yo wm with GERD and now a new diagnosis of gastroparesis. Heartburn and reflux beter on pantoprazole. Still having abdominal pain in upper and mid abdomen. At night and sometimes during work. She has nausea "alot" but no vomiting. Sleep is distured by the symptoms.  She moves her bowels once daily or every other day with HerbLax. Fiber supplement stopped due to bloating.   GI Review of Systems    Reports acid reflux and  nausea.      Denies abdominal pain, belching, bloating, chest pain, dysphagia with liquids, dysphagia with solids, heartburn, loss of appetite, vomiting, vomiting blood, weight loss, and  weight gain.        Denies anal fissure, black tarry stools, change in bowel habit, constipation, diarrhea, diverticulosis, fecal incontinence, heme positive stool, hemorrhoids, irritable bowel syndrome, jaundice, light color stool, liver problems, rectal bleeding, and  rectal pain.    Current Medications (verified): 1)  Lisinopril-Hydrochlorothiazide 20-25 Mg Tabs (Lisinopril-Hydrochlorothiazide) .... Take 1 Tablet By Mouth Once A Day 2)  Zoloft 100 Mg Tabs (Sertraline Hcl) .... Take 2 Tablet By Mouth Daily 3)  Tylenol Extra Strength 500 Mg Tabs (Acetaminophen) .... Take 1 Tablet By Mouth As Needed 4)  Astepro 0.15 % Soln (Azelastine Hcl) .... As Needed 5)  Xyzal 5 Mg Tabs (Levocetirizine Dihydrochloride) .... Take 1 Tablet By Mouth Once A Day 6)  Alka-Seltzer Antacid 500 Mg Caps (Calcium Carbonate Antacid) .... As Needed 7)  Shaklee Herbal Laxative .[name].. 4 By Mouth Each Night 8)  Benefiber  Powd (Wheat Dextrin) .... Please Dissolve One Tablespoon or One Packet Into 8 Oz of Water Daily. 9)  Pantoprazole Sodium 40 Mg Tbec  (Pantoprazole Sodium) .[name].. 1 By Mouth Two Times A Day Take 30 Minutes Before Breakfast and 30 Minutes Before Supper 10)  Anusol-Hc 25 Mg Supp (Hydrocortisone Acetate) .... As Needed  Allergies (verified): 1)  Sulfa  Past History:  Past Medical History: Depression GERD, ? Barrett's Hyperlipidemia Hypertension Anxiety Disorder Chronic Headaches Hiatal Hernia Diverticulosis Gastroparesis   Past Surgical History: Reviewed history from 02/22/2010 and no changes required. Hysterectomy Sinus Surgery   Family History: Reviewed history from 02/22/2010 and no changes required. 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  Illicit Drug Use - no Daily Caffeine Use: 5 daily  Alcohol Use - no  Vital Signs:  Patient profile:   50 year old female Height:      65 inches Weight:      150 pounds BMI:     25.05 BSA:     1.75 Pulse rate:   74 / minute Pulse rhythm:   regular BP sitting:   100 / 68  (left arm) Cuff size:   regular  Vitals Entered By: Hope Pigeon CMA (April 19, 2010 9:28 AM)  Physical Exam  General:  Well developed, well nourished, no acute distress.   Impression & Recommendations:  Problem # 1:  GASTROPARESIS (ICD-536.3) Assessment New 66% gastric contents retained at 2 hours on recent gastric emptying study. This can explain her problems with reflux and possibly her LUQ pain. Will start metaclopramide as writen on med list. I  reviewed the possible neurologic side effects including tardive dyskinesia. Her husband was present. Plan is for a trial to see if efficacious and then determine if long-term Tx appropriate. May consider domperidone but that is less effective prokinetic.  Problem # 2:  GERD (ICD-530.81) Assessment: Improved two times a day pantoprazole is helping treatment of gastroparesis should  also help she is reducing caffeine and head of bed is elevated  Problem # 3:  LUQ PAIN (ICD-789.02) Assessment: Unchanged will see if tretment of gastroparesis elps  Problem # 4:  CONSTIPATION (ICD-564.00) Assessment: Improved Will try MiraLax and less stimulant laxatives, if possible fiber stopped due to bloating whih is not a surprise. constipation raises ? of mre diffuse motility disorder  Patient Instructions: 1)  Please pick up your medications at your pharmacy. 2)  Metaclopramide was prescribed, MiraLax recommended and placed on your medication list, and fiber removed. 3)  Copy sent to : [facility] 4)  Gastroparesis brochure given. Please follow the step 3 diet as best possible and eat more fequent and smaller meals. 5)  Please schedule a follow-up appointment in 6 to 8 weeks.  6)  If you think you are having side effects from the metaclopramide call back before your visit. 7)  The medication list was reviewed and reconciled.  All changed / newly prescribed medications were explained.  A complete medication list was provided to the patient / caregiver. Prescriptions: METOCLOPRAMIDE HCL 10 MG  TABS (METOCLOPRAMIDE HCL) 1/2 to 1 tablet 30 minutes before meals and at bedtime  #120 x 1   Entered and Authorized by:   Gatha Mayer MD, [facility]   Signed by:   Gatha Mayer MD, [facility] on 04/19/2010   Method used:   Electronically to        [facility] (retail)       [place], [place]  [id number]       Ph: [phone]       Fax: [phone]   RxID:   [id number]

## 2010-04-28 NOTE — Progress Notes (Signed)
Summary: speak to nurse  Phone Note Call from Patient Call back at Work Phone [phone]   Caller: Patient Call For: Carlean Purl Reason for Call: Talk to Nurse Summary of Call: Patient wants to speak to nurse  Initial call taken by: Ronalee Red,  April 20, 2010 2:45 PM  Follow-up for Phone Call        Patient reports the Metoclopramide as prescribed- AC&HS- is making her too tired. Patient wants to know if she can change to once daily, either at evening meal or bedtime? Shella Maxim RN  April 21, 2010 8:54 AM   Additional Follow-up for Phone Call Additional follow up Details #1::        take it at bedtime only and see what happens may also try 1/2 tablet twice a day Gatha Mayer MD, [facility]  April 21, 2010 8:57 AM     Additional Follow-up for Phone Call Additional follow up Details #2::    Lmom for patient to return my call. Shella Maxim RN  April 21, 2010 9:38 AM   Informed patient of Dr Celesta Aver recommendations. Patient will call for further questions or problems. I did not change the med administration- waiting to see how patient does. Follow-up by: Shella Maxim RN,  April 21, 2010 9:58 AM

## 2010-05-05 ENCOUNTER — Telehealth: Payer: Self-pay | Admitting: Internal Medicine

## 2010-05-12 NOTE — Progress Notes (Signed)
Summary: ? re meds  Phone Note Call from Patient Call back at Work Phone [phone]   Caller: Patient Call For: Dr Carlean Purl Reason for Call: Talk to Nurse Summary of Call: Patient wants to speak to nurse regarding reflux meds. Initial call taken by: Ronalee Red,  May 05, 2010 12:25 PM  Follow-up for Phone Call        Patient wants [place] to see if her estrogen medicine could be causing her reflux.  She does not know the name of the medication.  She will call back once she has the name of the medications.  She then said she would just wait and discuss this with him at her follow up.  I have asked her to call back with the name of the medication if we can help her. Follow-up by: Barb Merino RN, [place],  May 05, 2010 3:49 PM

## 2010-05-14 ENCOUNTER — Telehealth: Payer: Self-pay | Admitting: Internal Medicine

## 2010-05-18 NOTE — Progress Notes (Signed)
Summary: Triage  Phone Note Call from Patient Call back at Work Phone [phone]   Caller: Patient Call For: Dr. Carlean Purl Reason for Call: Talk to Nurse Summary of Call: Asking to speak directly to nurse. She woke up this morning and she is swollen all over her body Initial call taken by: Webb Laws,  May 14, 2010 8:54 AM  Follow-up for Phone Call        patient c/o swollen ankles and "everywhere". Patient is advied to contact her primary care for eval.  Patient verblaized understanding. Follow-up by: Barb Merino RN, [place],  May 14, 2010 10:38 AM  Additional Follow-up for Phone Call Additional follow up Details #1::       Additional Follow-up by: Gatha Mayer MD, [facility] Regal,  May 14, 2010 12:26 PM

## 2010-05-31 ENCOUNTER — Encounter: Payer: Self-pay | Admitting: Internal Medicine

## 2010-05-31 ENCOUNTER — Ambulatory Visit (INDEPENDENT_AMBULATORY_CARE_PROVIDER_SITE_OTHER): Payer: BC Managed Care – PPO | Admitting: Internal Medicine

## 2010-05-31 DIAGNOSIS — R142 Eructation: Secondary | ICD-10-CM

## 2010-05-31 DIAGNOSIS — R141 Gas pain: Secondary | ICD-10-CM

## 2010-05-31 DIAGNOSIS — R143 Flatulence: Secondary | ICD-10-CM

## 2010-05-31 DIAGNOSIS — K3184 Gastroparesis: Secondary | ICD-10-CM

## 2010-06-03 LAB — COMPREHENSIVE METABOLIC PANEL
AST: 38 U/L — ABNORMAL HIGH (ref 0–37)
Albumin: 3.9 g/dL (ref 3.5–5.2)
Calcium: 9.3 mg/dL (ref 8.4–10.5)
Creatinine, Ser: 0.79 mg/dL (ref 0.4–1.2)
GFR calc Af Amer: >60 mL/min (ref >60)
Total Protein: 8.1 g/dL (ref 6.0–8.3)

## 2010-06-03 LAB — CBC
MCH: 33.2 pg (ref 26.0–34.0)
MCHC: 35 g/dL (ref 30.0–36.0)
Platelets: 232 10*3/uL (ref 150–400)

## 2010-06-03 LAB — DIFFERENTIAL
Eosinophils Relative: 6 % — ABNORMAL HIGH (ref 0–5)
Lymphocytes Relative: 30 % (ref 12–46)
Lymphs Abs: 1.9 10*3/uL (ref 0.7–4.0)
Monocytes Absolute: 0.5 10*3/uL (ref 0.1–1.0)
Monocytes Relative: 9 % (ref 3–12)

## 2010-06-03 LAB — LIPASE, BLOOD: Lipase: 66 U/L — ABNORMAL HIGH (ref 11–59)

## 2010-06-08 NOTE — Assessment & Plan Note (Signed)
Summary: 6-8 WEEK FU   Vital Signs:  Patient profile:   50 year old female Height:      65 inches Weight:      157 pounds BMI:     26.22 Pulse rate:   88 / minute Pulse rhythm:   regular BP sitting:   108 / 70  (left arm)  Vitals Entered By: Randye Lobo NCMA (May 31, 2010 10:45 AM)  History of Present Illness Visit Type: Follow-up Visit Primary GI MD: Silvano Rusk MD [facility] Primary Provider: [facility]) Requesting Provider: na Chief Complaint: 6-8 week follow-up.  Pt. still c/o acid reflux and bloating.   History of Present Illness:   50 yo ww with gastroparesis and GERD. She started metaclopramide 10 mg nd is taking two times a day. Following gas diet, avoiding foods on the list. She has had no improvement. she is tired on that and that is why she is only taking twice day. She had fatigue prior to that treatment tough. She remains bloated with heartburn, abdominal pain and bloated. Moving her bowels with "Herblax", using every other day but it was better on daily use. MiraLax did not promote defecation at once a day. She called [place] about "sweling all over" and saw cardiologist, was treated with diuretics sccessfully and is awaiting test results. Echocardiogram was performed.    GI Review of Systems    Reports bloating and  weight gain.      Denies abdominal pain, acid reflux, belching, chest pain, dysphagia with liquids, dysphagia with solids, heartburn, loss of appetite, nausea, vomiting, vomiting blood, and  weight loss.      Reports change in bowel habits and  constipation.     Denies anal fissure, black tarry stools, diarrhea, diverticulosis, fecal incontinence, heme positive stool, hemorrhoids, irritable bowel syndrome, jaundice, light color stool, liver problems, rectal bleeding, and  rectal pain.  EGD  Procedure date:  03/23/2010  Findings:        1) Ulcers, multiple in the distal esophagus - consistent with     reflux esophagitis     2) 5  cm hiatal hernia     3) Otherwise normal examination   - INFLAMED SQUAMOUS MUCOSA AND DETACHED FRAGMENTS INFLAMMATORY EXUDATE. - NO EVIDENCE FUNGAL ORGANISM OR VIRAL CYTOPATHIC CHANGES. - NO EVIDENCE OF EOSINOPHILIC ESOPHAGITIS. - NO EVIDENCE OF INTESTINAL METAPLASIA, DYSPLASIA OR MALIGNANCY.   Current Medications (verified): 1)  Lisinopril-Hydrochlorothiazide 20-25 Mg Tabs (Lisinopril-Hydrochlorothiazide) .... Take 1 Tablet By Mouth Once A Day 2)  Zoloft 100 Mg Tabs (Sertraline Hcl) .... Take 2 Tablet By Mouth Daily 3)  Tylenol Extra Strength 500 Mg Tabs (Acetaminophen) .... Take 1 Tablet By Mouth As Needed 4)  Astepro 0.15 % Soln (Azelastine Hcl) .... As Needed 5)  Xyzal 5 Mg Tabs (Levocetirizine Dihydrochloride) .... Take 1 Tablet By Mouth Once A Day 6)  Shaklee Herbal Laxative .[name].. 4 By Mouth Each Night As Needed For Constipation (Pt. Has Been Taking It Every Other Day)  Will Resume Once Daily 7)  Pantoprazole Sodium 40 Mg Tbec (Pantoprazole Sodium) .[name].. 1 By Mouth Two Times A Day Take 30 Minutes Before Breakfast and 30 Minutes Before Supper 8)  Metoclopramide Hcl 10 Mg  Tabs (Metoclopramide Hcl) .... 1/2 To 1 Tablet 30 Minutes Before Meals and At Bedtime 9)  Estro .[name].. 1 By Mouth Once Daily  Allergies (verified): 1)  Sulfa  Past History:  Past Medical History: Reviewed history from 04/19/2010 and no changes required. Depression GERD, ?  Barrett's Hyperlipidemia Hypertension Anxiety Disorder Chronic Headaches Hiatal Hernia Diverticulosis Gastroparesis   Past Surgical History: Reviewed history from 02/22/2010 and no changes required. Hysterectomy Sinus Surgery   Family History: Reviewed history from 02/22/2010 and no changes required. 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  Illicit Drug Use - no Daily  Caffeine Use: 5 daily  Alcohol Use - no  Physical Exam  General:  Well developed, well nourished, no acute distress. Heart:  Regular rate and rhythm; no murmurs, rubs,  or bruits. Abdomen:  nontender no masses, herniae, HSM BS+ no splash no bruits   Impression & Recommendations:  Problem # 1:  GASTROPARESIS (ICD-536.3) Assessment Unchanged She was not helped by metaclopramide so will dc and try domperidone. Not as efficacious but may tolerate better as she had fatigue and somnolence on metaclopramide. if this fails to achieve success then a tertiary referral for motility disturbance likely.  Problem # 2:  FLATULENCE ERUCTATION AND GAS PAIN (ICD-787.3) Assessment: New trial of metronidazole to treat possible small bowel bacterial overgrowth. some of her pain is likely neuropathic.  Problem # 3:  GERD (ICD-530.81) Assessment: Unchanged Need to improve gastric emptying to help this, I believe. continue PPI.  Patient Instructions: 1)  Stop metaclopramide. 2)  [facility] your [facility] for Domperidone and mailing information.  3)  Your prescription for Flagyl has been sent to [facility]  4)  Copy sent to : Ted Mcalpine, MD [facility]) 5)  The medication list was reviewed and reconciled.  All changed / newly prescribed medications were explained.  A complete medication list was provided to the patient / caregiver.

## 2010-06-11 ENCOUNTER — Telehealth: Payer: Self-pay | Admitting: Internal Medicine

## 2010-06-11 DIAGNOSIS — K3184 Gastroparesis: Secondary | ICD-10-CM

## 2010-06-11 NOTE — Telephone Encounter (Signed)
Pt feels Domperidone is not helping.  She has been taking it for about 1 week.  Having a lot of abdominal pain and cramping along with vomiting last night.  Per your last office note may need tertiary referral.  She is wondering if she should go back on Reglan.  Please advise.

## 2010-06-11 NOTE — Telephone Encounter (Signed)
I spoke with the patient she is aware of Dr Celesta Aver recommendations.  Dr Derrill Kay scheduled is not available at this time, they have asked that I fax records and they will call me back with an appt date and time. Patient aware.  Notes faxed to attention Albina Billet at [phone].  She is aware it is ok to restart reglan.

## 2010-06-11 NOTE — Telephone Encounter (Signed)
She can retry the reglan - initiate referral to Dr. Derrill Kay at [facility] re: gastroparesis

## 2010-06-17 ENCOUNTER — Telehealth: Payer: Self-pay | Admitting: Internal Medicine

## 2010-06-17 NOTE — Telephone Encounter (Signed)
I called and spoke with [facility] they have the patient's records and are still in for review.  They will call me with an appt date and time once the records have been reviewed by Dr Derrill Kay.

## 2010-06-17 NOTE — Telephone Encounter (Signed)
I did speak with the patient and she is aware that the records are at [facility] for review and we will call her as soon as we know the date and time of the appointment.

## 2010-06-29 ENCOUNTER — Telehealth: Payer: Self-pay | Admitting: Internal Medicine

## 2010-06-29 NOTE — Telephone Encounter (Signed)
Patient advised that I have been in contact with [facility] and they are still reviewing her records, we will call her with the appointment when available.

## 2010-07-06 ENCOUNTER — Telehealth: Payer: Self-pay | Admitting: Internal Medicine

## 2010-07-06 MED ORDER — DEXLANSOPRAZOLE 60 MG PO CPDR: 60.0000 mg | DELAYED_RELEASE_CAPSULE | Freq: Every day | ORAL | Status: DC

## 2010-07-06 NOTE — Telephone Encounter (Signed)
She may try Dexilant 60 mg daily samples instead of pantoprazole bid. Her insurance may not cover it so long-term use could be problematioc. 2 week trial reasonable.  I do not know status of [facility] appt will need to check that. It does take a long time but I think they have the expertise there.

## 2010-07-06 NOTE — Telephone Encounter (Signed)
Patient calling to see if her appointment at [facility] is scheduled yet. She is interested in knowing if she can go somewhere else since it is taking them so long to get her an appointment. Also, she wants to know if Dr. Roel Cluck Dexilant might help her. Please, advise.

## 2010-07-06 NOTE — Telephone Encounter (Signed)
Patient given Dr. Celesta Aver recommendations. She wants to try the Dexilant instead of Pantoprazole. Samples up front for pick up.

## 2010-07-12 ENCOUNTER — Other Ambulatory Visit: Payer: Self-pay | Admitting: Internal Medicine

## 2010-07-12 DIAGNOSIS — K3184 Gastroparesis: Secondary | ICD-10-CM

## 2010-07-12 NOTE — Progress Notes (Signed)
Patient advised of appt date and time for 08/18/10 11:00 with Dr Derrill Kay

## 2010-08-06 NOTE — Op Note (Signed)
[facility]  Patient:    Meagan Harmon                      MRN: [id number] Proc. Date: 03/29/99 Adm. Date:  [id number] Attending:  Courtney Heys                           Operative Report  PREOPERATIVE DIAGNOSIS:       Abnormal uterine bleeding.  POSTOPERATIVE DIAGNOSIS:      Abnormal uterine bleeding.  Endometriosis.  OPERATION:                    Laparoscopically-assisted vaginal hysterectomy.  SURGEON:                      Darlyn Chamber, M.D.  ASSISTANT:                    Jerald Kief, M.D.  ANESTHESIA:                   General endotracheal anesthesia.  ESTIMATED BLOOD LOSS:         200 to 300 cc.  PACKS AND DRAINS:             None.  BLOOD REPLACED:               None.  COMPLICATIONS:                None.  INDICATIONS:                  As dictated in the history and physical.  DESCRIPTION OF PROCEDURE:     The patient was taken to the OR and placed in the  supine position. After a satisfactory level of general endotracheal anesthesia as obtained, the patient was placed in the dorsal lithotomy position using the Allen stirrups.  The abdomen, perineum, and vagina were prepped out with Betadine. The bladder was emptied by in-and-out catheterization.  Examination under anesthesia revealed the uterus to be midposition, normal size, and shape, adnexa unremarkable. Hulka tenaculum was put in place and secured and the patient was draped out for  laparoscopy.  A subumbilical incision was made with the knife.  The attempts to  insert the Veress needle were unsuccessful.  We continued to get high pressures. Therefore, we proceeded with open laparoscopy.  The fascia was identified and entered sharply.  The incision to the fascia was extended laterally.  Muscles were manually separated in the midline.  The peritoneum was manually entered.  There was no evidence of adhesions to the periumbilical area.  Two lateral sutures  of 0 Vicryl were put in place and held and the Hasson cannula was put in place and secured with the held sutures.  The abdomen was inflated with the carbon dioxide. Close visualization revealed no evidence of bowel injury from the Veress needle and there was no vascular injury that we could tell of.  A 5 mm trocar was put in place under direct visualization.  The uterus was upper limits of normal size with slight irregularities.  There were areas of endometriosis in the cul-de-sac area. Both ovaries and tubes were unremarkable.  Both round ligaments were cauterized using the Bovie and incised.  Both tubes including mesosalpinx were cauterized and incised.  Finally both utero-ovarian ligaments were cauterized and incised. This completely separated the adnexa on  both sides.  Cautery was continued until resistance read 0 each time and we did have good hemostasis bilaterally.  At this point in time, the attention was to proceed vaginally.  The abdomen was deflated of its carbon dioxide and the laparoscope was removed.  The patients legs were repositioned and the weighted speculum was placed in the  vaginal vault.  The Hulka tenaculum was removed.  The cervix was grasped with a  Ardis Hughs tenaculum.  The cul-de-sac was entered sharply.  Both uterosacral ligaments were clamped, cut, and suture ligated with 0 Vicryl.  The reflection of the vaginal mucosa anteriorly was incised and the bladder dissected superiorly.  The paracervical tissue was clamped, cut, and suture ligated with 0 Vicryl.  The vesicouterine space was entered sharply and retractor was put in place to retract the bladder superiorly.  The uterine vessels were then clamped, cut, and suture  ligated with 0 Vicryl.  Next, using the clamp, cut, and tie technique with suture ligatures of 0 Vicryl, the parametrium was serially separated from the sides of the uterus.  The uterus was then flipped.  The remaining pedicles were  clamped and ut and the uterus was passed off the operative field and held.  The vascular pedicles were ligated with a free tie of 0 Vicryl.  The posterior cuff was then run with a running locking suture of 0 Vicryl.  The vaginal mucosa was reapproximated in the midline with interrupted figure-of-eight sutures of 0 Vicryl.  The Foley was placed to straight drain to achieve an adequate amount of clear urine.  A sponge on a spongestick was placed in the vaginal vault for manipulation.  Attention was now turned back to the umbilical area.  The legs were repositioned and abdomen was inflated with carbon dioxide.  The laparoscope was reintroduced for visualization.  Visualization of the cuff revealed no active bleeding.  Both ovarian pedicles were hemostatically intact.  Revisualization of the abdominal rea again no evidence of injury to adjacent organs.  The appendix was retrocecal and unremarkable.  The abdomen was deflated of its carbon dioxide.  All trocars were removed.  Subumbilical fascia was closed with a figure-of-eight of 0 Vicryl. The skin was closed with a running subcuticular of 4-0 Vicryl.  The suprapubic incision was closed with Steri-Strips.  Sponge on a spongestick was taken out of the vaginal vault.  The patients legs were taken down.  Once extubated, the patient was transferred to the recovery room in good condition.  Sponge, needle, and instrument counts were correct by circulating nurse x 2.  Foley catheter remained clear at the time of closure. DD:  03/29/99 TD:  03/29/99 Job: [id number]

## 2010-11-23 ENCOUNTER — Encounter: Payer: Self-pay | Admitting: Internal Medicine

## 2010-12-18 ENCOUNTER — Other Ambulatory Visit: Payer: Self-pay | Admitting: Internal Medicine

## 2010-12-21 NOTE — Telephone Encounter (Signed)
Patient stated she get the medication from another physician. pharmacy sent it to the wrong physician. I told the patient that I would refuse the medication.

## 2010-12-21 NOTE — Telephone Encounter (Signed)
Left a message on voice mail for patient to return my call. ? Aciphex refill which medication is patient on?

## 2011-03-18 IMAGING — CT CT ABD-PELV W/ CM
2 of 5 series · 17 of 46 positions shown, 19 images · IV contrast (agent unspecified)
Comparison: None.

CLINICAL DATA: Abdominal pain.  Pain radiates down the left side.
Nausea.

CT ABDOMEN AND PELVIS WITH CONTRAST
TECHNIQUE: Multidetector CT imaging of the abdomen and pelvis was
performed following the standard protocol during bolus
administration of intravenous contrast.
Contrast: 100 ml [id number]-Z22

[Series 2: rtn a/p with · axial · 0.65mm/px · z∈[-416,-6]mm · 14 of 92 slices shown, 16 images]
[im 5/92  soft-tissue]
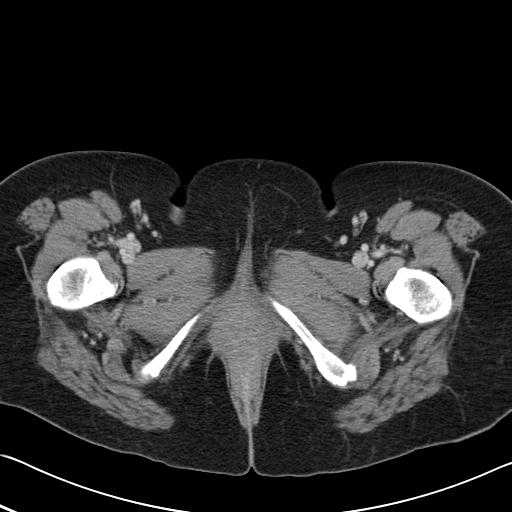
[im 5/92  bone]
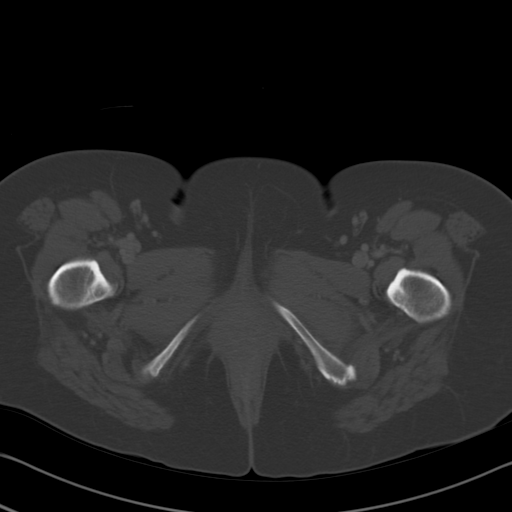
[im 10/92  soft-tissue]
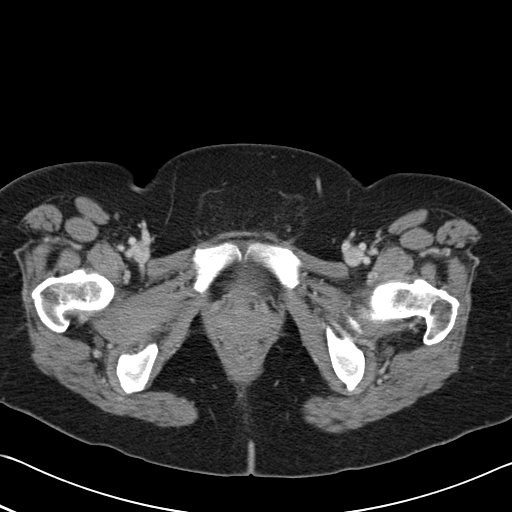
[im 20/92  soft-tissue]
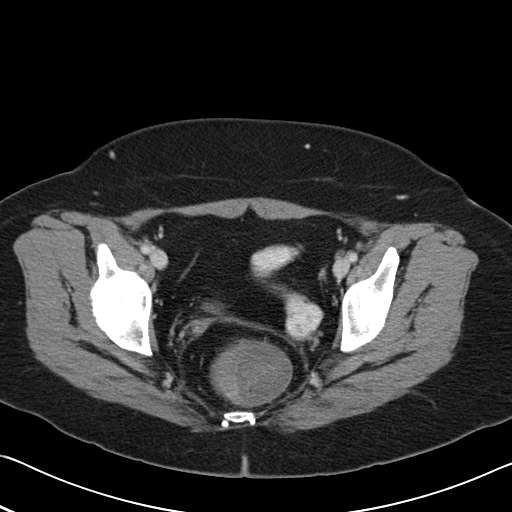
[im 24/92  soft-tissue]
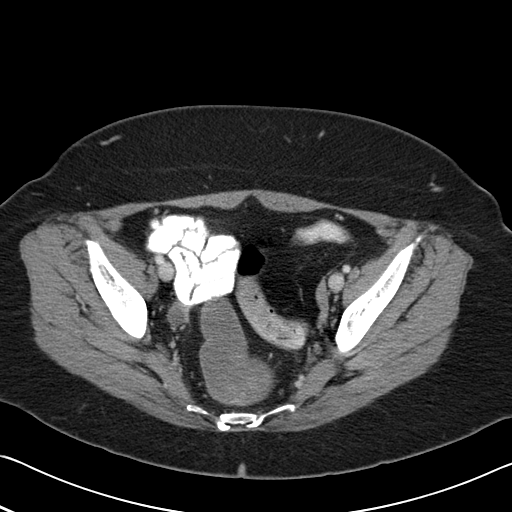
[im 29/92  soft-tissue]
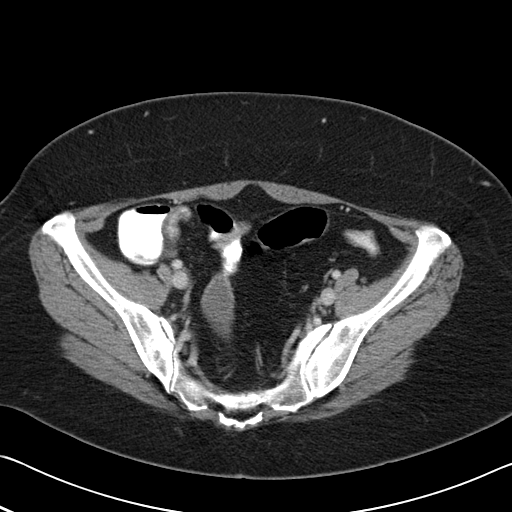
[im 39/92  soft-tissue]
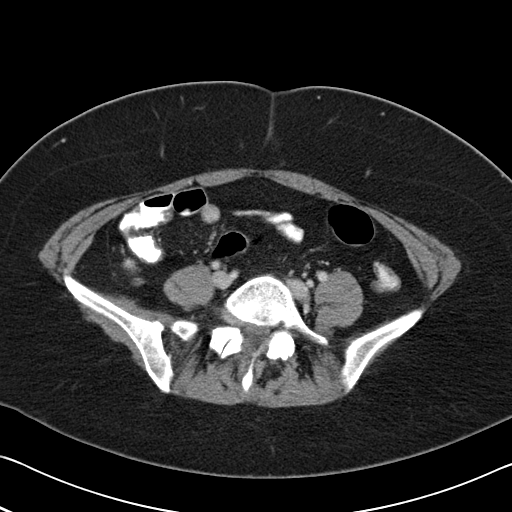
[im 44/92  soft-tissue]
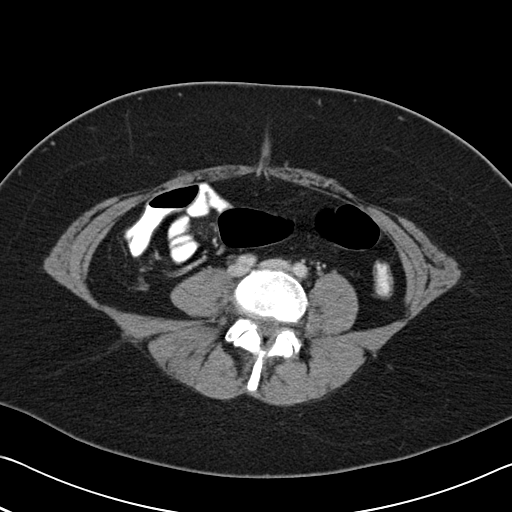
[im 48/92  soft-tissue]
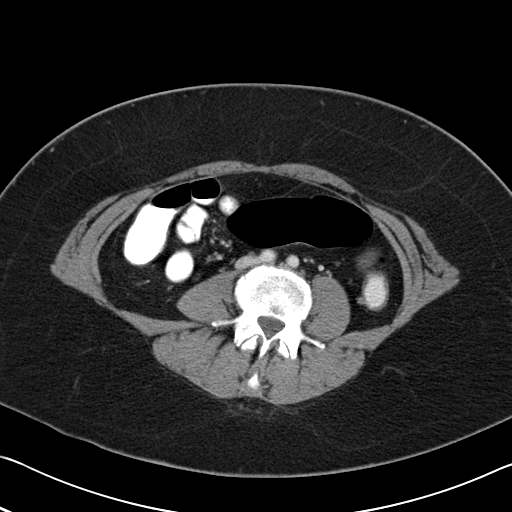
[im 53/92  soft-tissue]
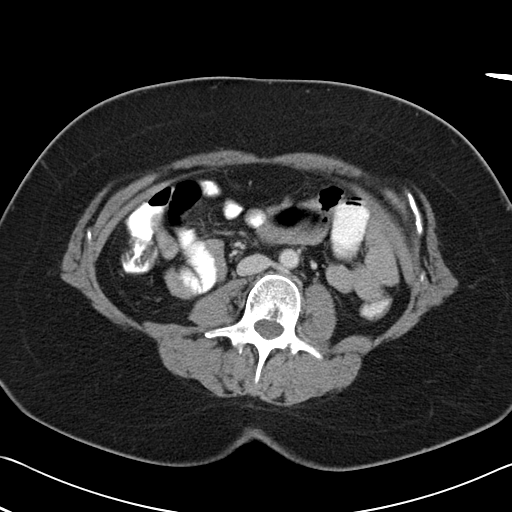
[im 53/92  bone]
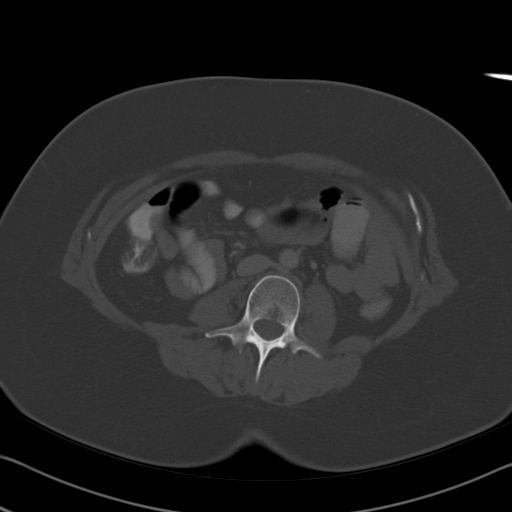
[im 63/92  soft-tissue]
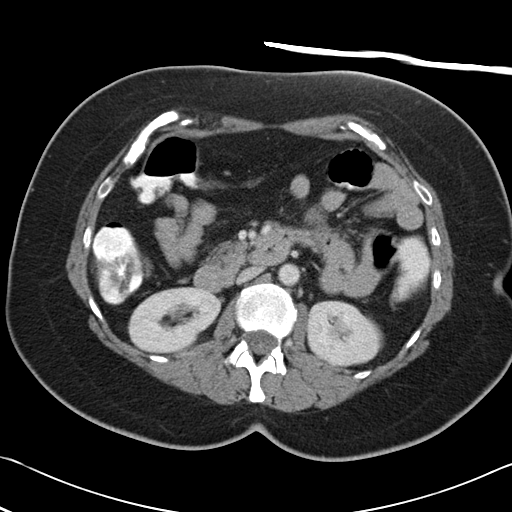
[im 68/92  soft-tissue]
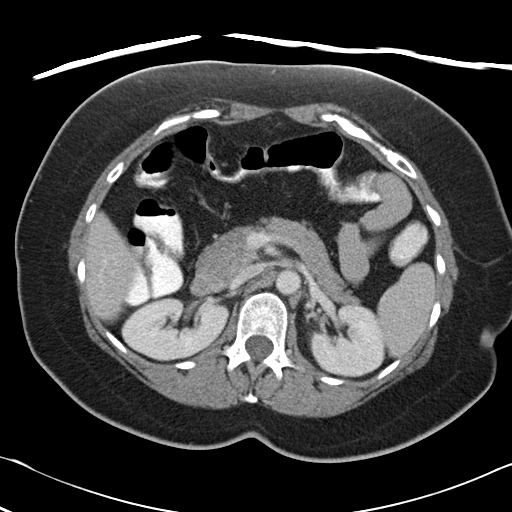
[im 72/92  soft-tissue]
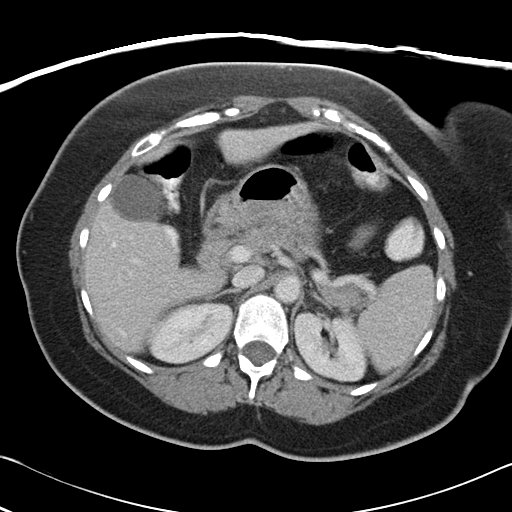
[im 82/92  soft-tissue]
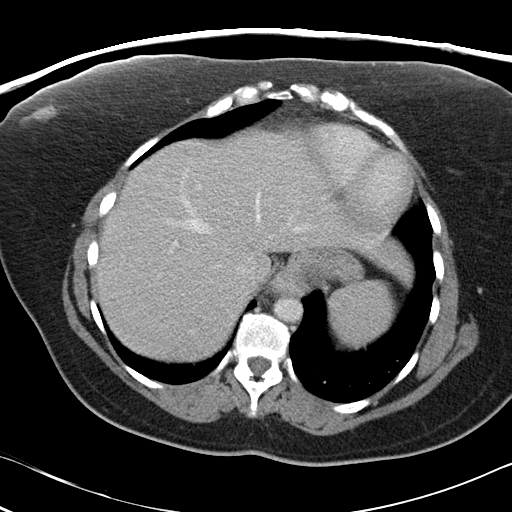
[im 87/92  soft-tissue]
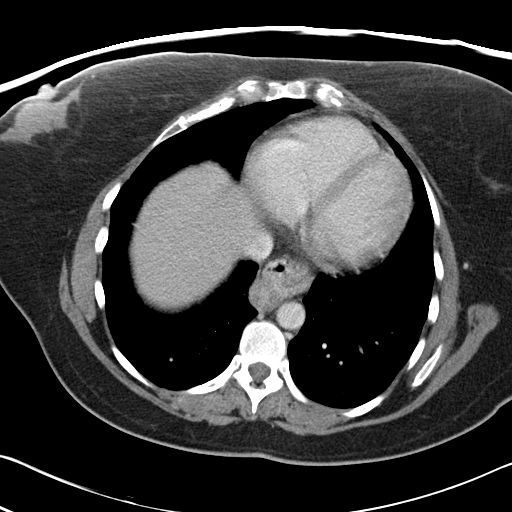

[Series 602: <mpr thick range> · coronal · 0.89mm/px · 3 of 87 slices shown]
[im 29/87  soft-tissue]
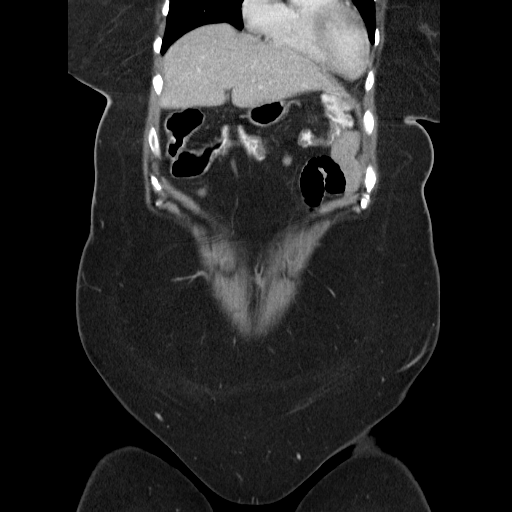
[im 39/87  soft-tissue]
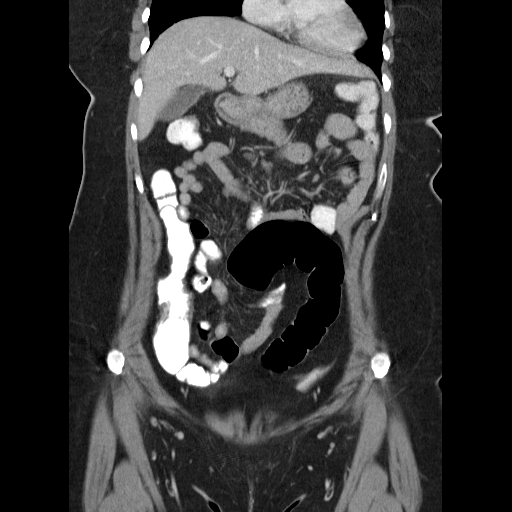
[im 48/87  soft-tissue]
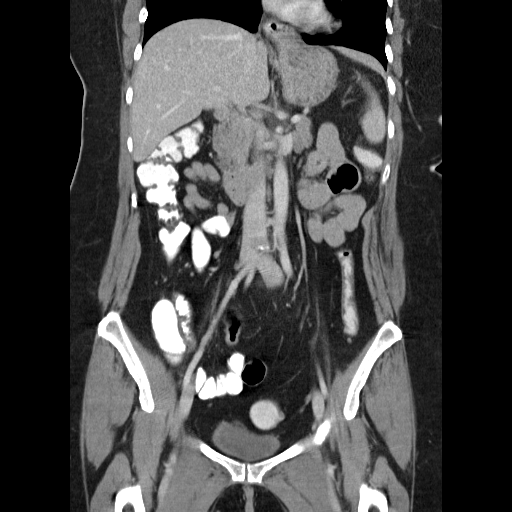

[17 of 46 positions shown; findings below may reference images not displayed]

FINDINGS: Small hiatal hernia noted.  There does appear to be some
circumferential wall thickening in the distal esophagus.
Esophagitis would be a consideration although neoplasm can have
this appearance.  The liver, spleen, duodenum, pancreas,
gallbladder, and adrenal glands are unremarkable.  The kidneys have
normal imaging features.

No abdominal aortic aneurysm.  There is no free fluid in the
abdomen. No abdominal lymphadenopathy.

No free fluid in the pelvis.  No evidence for pelvic
lymphadenopathy.  Bladder is normal.  Uterus is surgically absent.
There is no adnexal mass.  Fluid in the rectum suggest diarrhea.
Scattered diverticular changes seen in the left colon without
diverticulitis.  The terminal ileum is normal.  The appendix is
normal.

Bone windows reveal no worrisome lytic or sclerotic osseous
lesions.
IMPRESSION: A small hiatal hernia with mild circumferential wall thickening in
the distal esophagus.  This may be related to esophagitis although
the entire extent of the abnormal esophagus is not visualized on
today's study and esophageal neoplasm would also be a
consideration.

Fluid is visible in the rectum, suggesting diarrhea.  No evidence
for colonic wall thickening to suggest colitis.

## 2011-04-20 ENCOUNTER — Encounter: Payer: Self-pay | Admitting: Internal Medicine

## 2011-09-26 ENCOUNTER — Telehealth: Payer: Self-pay | Admitting: Internal Medicine

## 2011-09-26 NOTE — Telephone Encounter (Signed)
Patient c/o reflux and "ulcers". She has seen Dr. Derrill Kay at [facility] and he was unable to help her.  She would like to see a "proctologist" for these problems since the other doctors can't help her. I have explained to her that a proctologist could not help her with this problem and offered an appt with Dr. Carlean Purl.  She declined and will call back if she wants an appt.

## 2011-12-26 ENCOUNTER — Encounter: Payer: Self-pay | Admitting: Internal Medicine

## 2013-05-12 ENCOUNTER — Inpatient Hospital Stay (HOSPITAL_BASED_OUTPATIENT_CLINIC_OR_DEPARTMENT_OTHER)
Admission: EM | Admit: 2013-05-12 | Discharge: 2013-05-17 | DRG: 494 | Disposition: A | Discharge status: Home Health | Payer: BC Managed Care – PPO | Attending: Orthopedic Surgery | Admitting: Orthopedic Surgery

## 2013-05-12 ENCOUNTER — Emergency Department (HOSPITAL_BASED_OUTPATIENT_CLINIC_OR_DEPARTMENT_OTHER): Payer: BC Managed Care – PPO

## 2013-05-12 ENCOUNTER — Encounter (HOSPITAL_BASED_OUTPATIENT_CLINIC_OR_DEPARTMENT_OTHER): Payer: Self-pay | Admitting: Emergency Medicine

## 2013-05-12 DIAGNOSIS — K219 Gastro-esophageal reflux disease without esophagitis: Secondary | ICD-10-CM | POA: Diagnosis present

## 2013-05-12 DIAGNOSIS — Z6828 Body mass index (BMI) 28.0-28.9, adult: Secondary | ICD-10-CM

## 2013-05-12 DIAGNOSIS — S82142A Displaced bicondylar fracture of left tibia, initial encounter for closed fracture: Secondary | ICD-10-CM | POA: Diagnosis present

## 2013-05-12 DIAGNOSIS — S82109A Unspecified fracture of upper end of unspecified tibia, initial encounter for closed fracture: Principal | ICD-10-CM | POA: Diagnosis present

## 2013-05-12 DIAGNOSIS — W11XXXA Fall on and from ladder, initial encounter: Secondary | ICD-10-CM | POA: Diagnosis present

## 2013-05-12 DIAGNOSIS — F411 Generalized anxiety disorder: Secondary | ICD-10-CM | POA: Diagnosis present

## 2013-05-12 DIAGNOSIS — W19XXXA Unspecified fall, initial encounter: Secondary | ICD-10-CM

## 2013-05-12 DIAGNOSIS — M899 Disorder of bone, unspecified: Secondary | ICD-10-CM | POA: Diagnosis present

## 2013-05-12 DIAGNOSIS — I10 Essential (primary) hypertension: Secondary | ICD-10-CM | POA: Diagnosis present

## 2013-05-12 DIAGNOSIS — F329 Major depressive disorder, single episode, unspecified: Secondary | ICD-10-CM | POA: Diagnosis present

## 2013-05-12 DIAGNOSIS — F3289 Other specified depressive episodes: Secondary | ICD-10-CM | POA: Diagnosis present

## 2013-05-12 DIAGNOSIS — Z79899 Other long term (current) drug therapy: Secondary | ICD-10-CM

## 2013-05-12 DIAGNOSIS — M949 Disorder of cartilage, unspecified: Secondary | ICD-10-CM

## 2013-05-12 HISTORY — DX: Diaphragmatic hernia without obstruction or gangrene: K44.9

## 2013-05-12 HISTORY — DX: Anxiety disorder, unspecified: F41.9

## 2013-05-12 HISTORY — DX: Major depressive disorder, single episode, unspecified: F32.9

## 2013-05-12 HISTORY — DX: Diverticulosis of intestine, part unspecified, without perforation or abscess without bleeding: K57.90

## 2013-05-12 HISTORY — DX: Essential (primary) hypertension: I10

## 2013-05-12 HISTORY — DX: Depression, unspecified: F32.A

## 2013-05-12 HISTORY — DX: Gastro-esophageal reflux disease without esophagitis: K21.9

## 2013-05-12 HISTORY — DX: Hyperlipidemia, unspecified: E78.5

## 2013-05-12 LAB — BASIC METABOLIC PANEL
BUN: 14 mg/dL (ref 6–23)
CO2: 26 mEq/L (ref 19–32)
CREATININE: 0.8 mg/dL (ref 0.50–1.10)
Calcium: 9.5 mg/dL (ref 8.4–10.5)
Chloride: 100 mEq/L (ref 96–112)
GFR calc non Af Amer: 83 mL/min — ABNORMAL LOW (ref >90)
Glucose, Bld: 113 mg/dL — ABNORMAL HIGH (ref 70–99)
Potassium: 3.2 mEq/L — ABNORMAL LOW (ref 3.7–5.3)
Sodium: 141 mEq/L (ref 137–147)

## 2013-05-12 LAB — CBC WITH DIFFERENTIAL/PLATELET
BASOS ABS: 0 10*3/uL (ref 0.0–0.1)
BASOS PCT: 0 % (ref 0–1)
EOS PCT: 1 % (ref 0–5)
Eosinophils Absolute: 0.1 10*3/uL (ref 0.0–0.7)
HEMATOCRIT: 35.9 % — AB (ref 36.0–46.0)
Hemoglobin: 12.1 g/dL (ref 12.0–15.0)
Lymphocytes Relative: 24 % (ref 12–46)
Lymphs Abs: 2.4 10*3/uL (ref 0.7–4.0)
MCH: 31.8 pg (ref 26.0–34.0)
MCHC: 33.7 g/dL (ref 30.0–36.0)
MCV: 94.2 fL (ref 78.0–100.0)
MONO ABS: 0.9 10*3/uL (ref 0.1–1.0)
Monocytes Relative: 9 % (ref 3–12)
Neutro Abs: 6.4 10*3/uL (ref 1.7–7.7)
Neutrophils Relative %: 66 % (ref 43–77)
Platelets: 216 10*3/uL (ref 150–400)
RBC: 3.81 MIL/uL — ABNORMAL LOW (ref 3.87–5.11)
RDW: 13.8 % (ref 11.5–15.5)
WBC: 9.7 10*3/uL (ref 4.0–10.5)

## 2013-05-12 MED ORDER — METHOCARBAMOL 500 MG PO TABS
500.0000 mg | ORAL_TABLET | Freq: Four times a day (QID) | ORAL | Status: DC | PRN
  Administered 2013-05-13: 500 mg via ORAL
  Filled 2013-05-12: qty 1

## 2013-05-12 MED ORDER — ONDANSETRON HCL 4 MG/2ML IJ SOLN
4.0000 mg | Freq: Three times a day (TID) | INTRAMUSCULAR | Status: DC | PRN
  Administered 2013-05-12: 4 mg via INTRAVENOUS
  Filled 2013-05-12: qty 2

## 2013-05-12 MED ORDER — HYDROMORPHONE HCL PF 1 MG/ML IJ SOLN
1.0000 mg | INTRAMUSCULAR | Status: DC | PRN
  Administered 2013-05-12: 1 mg via INTRAVENOUS
  Filled 2013-05-12: qty 1

## 2013-05-12 MED ORDER — OXYCODONE-ACETAMINOPHEN 5-325 MG PO TABS
2.0000 | ORAL_TABLET | Freq: Once | ORAL | Status: AC
  Administered 2013-05-12: 2 via ORAL
  Filled 2013-05-12: qty 2

## 2013-05-12 MED ORDER — HYDROCODONE-ACETAMINOPHEN 5-325 MG PO TABS
1.0000 | ORAL_TABLET | ORAL | Status: DC | PRN
  Administered 2013-05-13 (×2): 2 via ORAL
  Filled 2013-05-12 (×2): qty 2

## 2013-05-12 NOTE — ED Notes (Signed)
Patient reports that she fell off 2 foot step stool in her home this evening. Unsure if patient hit head, she is unsure. Patient complains of left knee pain, landed on same. No nausea, denies headache. Spouse reports that patient did vomit in car x 1 prior to arrival. He reports syncopal episode. Patient now alert and oriented, denies nausea.

## 2013-05-12 NOTE — ED Notes (Signed)
Report given to [place], Therapist, sports and to [place], [place].  Family present at bedside.  Ready for transport.

## 2013-05-12 NOTE — ED Provider Notes (Signed)
CSN: [id number]     Arrival date & time 05/12/13  1714 History  This chart was scribed for Shaune Pollack, MD by Vernell Barrier, ED scribe. This patient was seen in room MH11/MH11 and the patient's care was started at 6:11 PM.    Chief Complaint  Patient presents with  . Fall   Patient is a 53 y.o. female presenting with fall and knee pain. The history is provided by the patient and a friend. No language interpreter was used.  Fall This is a new problem. The current episode started 1 to 2 hours ago. Pertinent negatives include no headaches.  Knee Pain Location:  Knee Time since incident:  2 hours Injury: yes   Mechanism of injury: fall   Fall:    Fall occurred:  From a stool   Impact surface:  Hard floor   Point of impact:  Head and knees Knee location:  L knee Pain details:    Quality:  Aching   Severity:  Moderate   Onset quality:  Gradual   Duration:  2 hours Chronicity:  New Worsened by:  Flexion Ineffective treatments:  None tried Associated symptoms: no back pain and no neck pain    HPI Comments: LUVENA GRISSOM is a 53 y.o. female who presents to the Emergency Department complaining of a fall occuring 2 hours ago. Pt now reports left knee pain and nausea. Pt states she fell backwards off a 2 foot step stool in her home; reports she remembers the whole event but is unsure of head impact. A woman that was with her states she begin holding her head after impact with the floor. Neighbor reports he carried her to the truck because she couldn't walk due to left knee pain as a result of the fall. Pts husband states upon entering the the vehicle, pt reported she felt nauseous and like she was going to pass out. Pt then lost consciousness and threw up on her self. Reports only 1 episode of emesis before reporting to the ER. Reports she was unresponsive for 15-30 seconds and felt cold and clammy. Denies back pain, neck pain, HA.   Past Medical History  Diagnosis Date  .  Hypertension   . GERD (gastroesophageal reflux disease)    History reviewed. No pertinent past surgical history. No family history on file. History  Substance Use Topics  . Smoking status: Never Smoker   . Smokeless tobacco: Not on file  . Alcohol Use: Not on file   OB History   Grav Para Term Preterm Abortions TAB SAB Ect Mult Living                 Review of Systems  Musculoskeletal: Negative for back pain and neck pain.       Left knee pain  Neurological: Negative for headaches.  All other systems reviewed and are negative.   Allergies  Sulfonamide derivatives  Home Medications   Current Outpatient Rx  Name  Route  Sig  Dispense  Refill  . RABEprazole (ACIPHEX) 20 MG tablet   Oral   Take 20 mg by mouth daily.          Triage Vitals: BP 146/76  Pulse 81  Temp(Src) 98.9 F (37.2 C) (Oral)  Resp 22  Ht 5\' 3"  (1.6 m)  Wt 160 lb (72.576 kg)  BMI 28.35 kg/m2  SpO2 100%  Physical Exam  Nursing note and vitals reviewed. Constitutional: She is oriented to person, place, and time. She appears  well-developed and well-nourished.  HENT:  Head: Normocephalic and atraumatic.  Right Ear: External ear normal.  Left Ear: External ear normal.  Nose: Nose normal.  Mouth/Throat: Oropharynx is clear and moist.  Eyes: Conjunctivae and EOM are normal. Pupils are equal, round, and reactive to light.  Neck: Normal range of motion.  Cardiovascular: Normal rate and normal heart sounds.   Pulmonary/Chest: Effort normal. No respiratory distress.  Musculoskeletal: Normal range of motion. She exhibits no tenderness.  Pain with left knee flexion.   Neurological: She is oriented to person, place, and time. She has normal reflexes.  Negative palmar drift.  Skin: Skin is warm and dry.  Psychiatric: She has a normal mood and affect. Her behavior is normal.   ED Course  Procedures  DIAGNOSTIC STUDIES: Oxygen Saturation is 100% on room air, normal by my interpretation.     COORDINATION OF CARE: At 6:20 PM: Discussed treatment plan with patient which includes pain medication, x-Adisyn Ruscitti of the left knee, and a CT scan of the head. Patient agrees.   6:35 PM: Pt is advised she suffered a tibial plateau fracture of the left knee and will be referred to an orthopedic surgeon for repair.  Imaging Review Ct Head Wo Contrast  05/12/2013   CLINICAL DATA:  Fall with loss of consciousness.  EXAM: CT HEAD WITHOUT CONTRAST  TECHNIQUE: Contiguous axial images were obtained from the base of the skull through the vertex without intravenous contrast.  COMPARISON:  None.  FINDINGS: No mass lesion. No midline shift. No acute hemorrhage or hematoma. No extra-axial fluid collections. No evidence of acute infarction. Brain parenchyma is normal. No acute osseous abnormality. Chronic changes in the paranasal sinuses.  IMPRESSION: No acute abnormality.   Electronically Signed   By: Rozetta Nunnery M.D.   On: 05/12/2013 18:11   Dg Knee Complete 4 Views Left  05/12/2013   CLINICAL DATA:  [place] and injured left knee.  EXAM: LEFT KNEE - COMPLETE 4+ VIEW  COMPARISON:  None.  FINDINGS: Comminuted fracture involving the lateral tibial plateau. Associated joint hemarthrosis with fat fluid level. Joint spaces well preserved. Bone mineral density appears well preserved. Minimal calcification at the insertion of the quadriceps tendon on the superior patella.  IMPRESSION: Comminuted lateral tibial plateau fracture.   Electronically Signed   By: Evangeline Dakin M.D.   On: 05/12/2013 18:15    MDM   Final diagnoses:  Tibial plateau fracture, left  Fall   Discussed with Dr. Alvan Dame via or nurse.  Plan transfer to [facility] for surgery in am.  Patient npo after mn.  Discussed with patient and family and voice understanding.  Patient is having preop labs drawn and temp admit orders are placed.    I personally performed the services described in this documentation, which was scribed in my presence. The recorded  information has been reviewed and considered.      Shaune Pollack, MD 05/12/13 [phone]

## 2013-05-12 NOTE — ED Notes (Signed)
Pt still c/o pain 10/10.  IV medications given.

## 2013-05-13 ENCOUNTER — Encounter (HOSPITAL_COMMUNITY): Payer: Self-pay | Admitting: Orthopedic Surgery

## 2013-05-13 ENCOUNTER — Inpatient Hospital Stay (HOSPITAL_COMMUNITY): Payer: BC Managed Care – PPO

## 2013-05-13 LAB — URINALYSIS, ROUTINE W REFLEX MICROSCOPIC
BILIRUBIN URINE: NEGATIVE
GLUCOSE, UA: NEGATIVE mg/dL
Hgb urine dipstick: NEGATIVE
Ketones, ur: NEGATIVE mg/dL
Leukocytes, UA: NEGATIVE
Nitrite: NEGATIVE
PH: 6 (ref 5.0–8.0)
Protein, ur: NEGATIVE mg/dL
SPECIFIC GRAVITY, URINE: 1.012 (ref 1.005–1.030)
Urobilinogen, UA: 0.2 mg/dL (ref 0.0–1.0)

## 2013-05-13 LAB — MRSA PCR SCREENING: MRSA BY PCR: NEGATIVE

## 2013-05-13 MED ORDER — ONDANSETRON HCL 4 MG/2ML IJ SOLN
4.0000 mg | Freq: Four times a day (QID) | INTRAMUSCULAR | Status: DC | PRN
  Administered 2013-05-16: 4 mg via INTRAVENOUS

## 2013-05-13 MED ORDER — BUPROPION HCL 100 MG PO TABS
100.0000 mg | ORAL_TABLET | Freq: Two times a day (BID) | ORAL | Status: DC
  Administered 2013-05-13 – 2013-05-17 (×9): 100 mg via ORAL
  Filled 2013-05-13 (×10): qty 1

## 2013-05-13 MED ORDER — AZELASTINE HCL 0.15 % NA SOLN: 2.0000 | Freq: Every day | NASAL | Status: DC

## 2013-05-13 MED ORDER — MORPHINE SULFATE 2 MG/ML IJ SOLN
1.0000 mg | INTRAMUSCULAR | Status: DC | PRN
  Administered 2013-05-13 – 2013-05-14 (×4): 2 mg via INTRAVENOUS
  Filled 2013-05-13 (×5): qty 1

## 2013-05-13 MED ORDER — SERTRALINE HCL 100 MG PO TABS
100.0000 mg | ORAL_TABLET | Freq: Two times a day (BID) | ORAL | Status: DC
  Administered 2013-05-13 – 2013-05-17 (×9): 100 mg via ORAL
  Filled 2013-05-13 (×10): qty 1

## 2013-05-13 MED ORDER — ACETAMINOPHEN 325 MG PO TABS: 650.0000 mg | ORAL_TABLET | Freq: Four times a day (QID) | ORAL | Status: DC | PRN

## 2013-05-13 MED ORDER — LACTATED RINGERS IV SOLN
INTRAVENOUS | Status: DC
  Administered 2013-05-13: 75 mL/h via INTRAVENOUS
  Administered 2013-05-14: 11:00:00 via INTRAVENOUS

## 2013-05-13 MED ORDER — CEFAZOLIN SODIUM-DEXTROSE 2-3 GM-% IV SOLR
2.0000 g | Freq: Once | INTRAVENOUS | Status: AC
  Administered 2013-05-14: 2 g via INTRAVENOUS
  Filled 2013-05-13 (×2): qty 50

## 2013-05-13 MED ORDER — HYDROMORPHONE HCL PF 1 MG/ML IJ SOLN
1.0000 mg | INTRAMUSCULAR | Status: DC | PRN
  Filled 2013-05-13: qty 1

## 2013-05-13 MED ORDER — ACETAMINOPHEN 650 MG RE SUPP: 650.0000 mg | Freq: Four times a day (QID) | RECTAL | Status: DC | PRN

## 2013-05-13 MED ORDER — CYANOCOBALAMIN 1000 MCG/ML IJ SOLN: 1000.0000 ug | Freq: Every day | INTRAMUSCULAR | Status: DC

## 2013-05-13 MED ORDER — ONDANSETRON HCL 4 MG PO TABS: 4.0000 mg | ORAL_TABLET | Freq: Four times a day (QID) | ORAL | Status: DC | PRN

## 2013-05-13 MED ORDER — OXYCODONE-ACETAMINOPHEN 5-325 MG PO TABS
1.0000 | ORAL_TABLET | Freq: Four times a day (QID) | ORAL | Status: DC | PRN
  Administered 2013-05-13 – 2013-05-17 (×9): 2 via ORAL
  Filled 2013-05-13 (×9): qty 2

## 2013-05-13 MED ORDER — PANTOPRAZOLE SODIUM 40 MG PO TBEC
40.0000 mg | DELAYED_RELEASE_TABLET | Freq: Every day | ORAL | Status: DC
  Administered 2013-05-13 – 2013-05-17 (×4): 40 mg via ORAL
  Filled 2013-05-13 (×4): qty 1

## 2013-05-13 MED ORDER — METHOCARBAMOL 500 MG PO TABS
500.0000 mg | ORAL_TABLET | Freq: Four times a day (QID) | ORAL | Status: DC | PRN
  Administered 2013-05-14 – 2013-05-17 (×6): 1000 mg via ORAL
  Filled 2013-05-13 (×7): qty 2

## 2013-05-13 MED ORDER — LEVOCETIRIZINE DIHYDROCHLORIDE 5 MG PO TABS: 5.0000 mg | ORAL_TABLET | Freq: Every day | ORAL | Status: DC

## 2013-05-13 MED ORDER — LORATADINE 10 MG PO TABS
10.0000 mg | ORAL_TABLET | Freq: Every day | ORAL | Status: DC
  Administered 2013-05-13 – 2013-05-17 (×5): 10 mg via ORAL
  Filled 2013-05-13 (×5): qty 1

## 2013-05-13 MED ORDER — METHOCARBAMOL 100 MG/ML IJ SOLN
500.0000 mg | Freq: Four times a day (QID) | INTRAVENOUS | Status: DC | PRN
  Administered 2013-05-13 – 2013-05-15 (×6): 1000 mg via INTRAVENOUS
  Filled 2013-05-13 (×9): qty 10

## 2013-05-13 MED ORDER — DOCUSATE SODIUM 100 MG PO CAPS
100.0000 mg | ORAL_CAPSULE | Freq: Two times a day (BID) | ORAL | Status: DC
  Administered 2013-05-13 (×2): 100 mg via ORAL
  Filled 2013-05-13 (×2): qty 1

## 2013-05-13 MED ORDER — AZELASTINE HCL 0.1 % NA SOLN
2.0000 | Freq: Every day | NASAL | Status: DC
  Filled 2013-05-13: qty 30

## 2013-05-13 MED ORDER — OXYCODONE HCL 5 MG PO TABS
5.0000 mg | ORAL_TABLET | ORAL | Status: DC | PRN
  Administered 2013-05-14 – 2013-05-15 (×3): 10 mg via ORAL
  Filled 2013-05-13 (×3): qty 2

## 2013-05-13 NOTE — H&P (Signed)
Meagan Harmon is an 53 y.o. female.    Chief Complaint:  Left tibial plateau fracture   HPI: [name] working on a step ladder yesterday when descending off steps missed judged the last one.  She had immediate onset of pain and when trying to get and put pressure on her left leg was unable due to pain and instability.  No other injuries to report.   PCP:  No primary provider on file.  D/C Plans: To be determined following appropriate treatment plan  PMH: Past Medical History  Diagnosis Date  . Hypertension   . GERD (gastroesophageal reflux disease)     PSH: History reviewed. No pertinent past surgical history.  Social History:  reports that she has never smoked. She does not have any smokeless tobacco history on file. Her alcohol and drug histories are not on file.  Allergies:  Allergies  Allergen Reactions  . Sulfonamide Derivatives     REACTION: vomiting    Medications: Medications Prior to Admission  Medication Sig Dispense Refill  . Azelastine HCl (ASTEPRO) 0.15 % SOLN Place 2 sprays into both nostrils daily.      [name] BIOTIN PO Take 1 tablet by mouth daily.      [name] buPROPion (WELLBUTRIN) 100 MG tablet Take 100 mg by mouth 2 (two) times daily.      . cyanocobalamin (,VITAMIN B-12,) 1000 MCG/ML injection Inject 1,000 mcg into the muscle daily.      [name] levocetirizine (XYZAL) 5 MG tablet Take 5 mg by mouth at bedtime.      [name] lisinopril (PRINIVIL,ZESTRIL) 10 MG tablet Take 10 mg by mouth daily.      . Misc Natural Products (NARCOSOFT HERBAL LAX) CAPS Take 2-3 capsules by mouth 2 (two) times daily. Takes 2 capsules in the morning and 3 capsules at night      . RABEprazole (ACIPHEX) 20 MG tablet Take 20 mg by mouth every morning.       . sertraline (ZOLOFT) 100 MG tablet Take 100 mg by mouth 2 (two) times daily.      [name] VITAMIN K PO Take 1 tablet by mouth daily.        Results for orders placed during the hospital encounter of 05/12/13 (from the past 48 hour(s))   CBC WITH DIFFERENTIAL     Status: Abnormal   Collection Time    05/12/13  7:15 PM      Result Value Ref Range   WBC 9.7  4.0 - 10.5 K/uL   RBC 3.81 (*) 3.87 - 5.11 MIL/uL   Hemoglobin 12.1  12.0 - 15.0 g/dL   HCT 35.9 (*) 36.0 - 46.0 %   MCV 94.2  78.0 - 100.0 fL   MCH 31.8  26.0 - 34.0 pg   MCHC 33.7  30.0 - 36.0 g/dL   RDW 13.8  11.5 - 15.5 %   Platelets 216  150 - 400 K/uL   Neutrophils Relative % 66  43 - 77 %   Neutro Abs 6.4  1.7 - 7.7 K/uL   Lymphocytes Relative 24  12 - 46 %   Lymphs Abs 2.4  0.7 - 4.0 K/uL   Monocytes Relative 9  3 - 12 %   Monocytes Absolute 0.9  0.1 - 1.0 K/uL   Eosinophils Relative 1  0 - 5 %   Eosinophils Absolute 0.1  0.0 - 0.7 K/uL   Basophils Relative 0  0 - 1 %   Basophils  Absolute 0.0  0.0 - 0.1 K/uL  BASIC METABOLIC PANEL     Status: Abnormal   Collection Time    05/12/13  7:15 PM      Result Value Ref Range   Sodium 141  137 - 147 mEq/L   Potassium 3.2 (*) 3.7 - 5.3 mEq/L   Chloride 100  96 - 112 mEq/L   CO2 26  19 - 32 mEq/L   Glucose, Bld 113 (*) 70 - 99 mg/dL   BUN 14  6 - 23 mg/dL   Creatinine, Ser 0.80  0.50 - 1.10 mg/dL   Calcium 9.5  8.4 - 10.5 mg/dL   GFR calc non Af Amer 83 (*) >90 mL/min   GFR calc Af Amer >90  >90 mL/min   Comment: (NOTE)     The eGFR has been calculated using the CKD EPI equation.     This calculation has not been validated in all clinical situations.     eGFR's persistently <90 mL/min signify possible Chronic Kidney     Disease.  MRSA PCR SCREENING     Status: None   Collection Time    05/12/13 10:48 PM      Result Value Ref Range   MRSA by PCR NEGATIVE  NEGATIVE   Comment:            The GeneXpert MRSA Assay (FDA     approved for NASAL specimens     only), is one component of a     comprehensive MRSA colonization     surveillance program. It is not     intended to diagnose MRSA     infection nor to guide or     monitor treatment for     MRSA infections.   Ct Head Wo Contrast  05/12/2013    CLINICAL DATA:  Fall with loss of consciousness.  EXAM: CT HEAD WITHOUT CONTRAST  TECHNIQUE: Contiguous axial images were obtained from the base of the skull through the vertex without intravenous contrast.  COMPARISON:  None.  FINDINGS: No mass lesion. No midline shift. No acute hemorrhage or hematoma. No extra-axial fluid collections. No evidence of acute infarction. Brain parenchyma is normal. No acute osseous abnormality. Chronic changes in the paranasal sinuses.  IMPRESSION: No acute abnormality.   Electronically Signed   By: Rozetta Nunnery M.D.   On: 05/12/2013 18:11   Dg Chest Port 1 View  05/12/2013   CLINICAL DATA:  Knee fracture, preoperative evaluation  EXAM: PORTABLE CHEST - 1 VIEW  COMPARISON:  Portable exam 1855 hr without priors for comparison.  FINDINGS: Upper normal heart size.  Normal mediastinal contours and pulmonary vascularity for technique.  No infiltrate, pleural effusion or pneumothorax.  Bones unremarkable.  IMPRESSION: No acute abnormalities.   Electronically Signed   By: Lavonia Dana M.D.   On: 05/12/2013 19:26   Dg Knee Complete 4 Views Left  05/12/2013   CLINICAL DATA:  [place] and injured left knee.  EXAM: LEFT KNEE - COMPLETE 4+ VIEW  COMPARISON:  None.  FINDINGS: Comminuted fracture involving the lateral tibial plateau. Associated joint hemarthrosis with fat fluid level. Joint spaces well preserved. Bone mineral density appears well preserved. Minimal calcification at the insertion of the quadriceps tendon on the superior patella.  IMPRESSION: Comminuted lateral tibial plateau fracture.   Electronically Signed   By: Evangeline Dakin M.D.   On: 05/12/2013 18:15    ROS: Review of Systems - Negative except that reported in HPI History obtained from the  patient General ROS: negative for - chills, fatigue, fever, night sweats or weight loss Respiratory ROS: no cough, shortness of breath, or wheezing Cardiovascular ROS: no chest pain or dyspnea on exertion Gastrointestinal ROS: no  abdominal pain, change in bowel habits, or black or bloody stools Genito-Urinary ROS: no dysuria, trouble voiding, or hematuria Musculoskeletal ROS: negative for - gait disturbance, joint pain, joint stiffness, muscle pain or muscular weakness  Physican Exam: Blood pressure 120/68, pulse 73, temperature 98.5 F (36.9 C), temperature source Oral, resp. rate 18, height 5' 3"  (1.6 m), weight 72.576 kg (160 lb), SpO2 96.00%. .physicalexam  Seen in hospital room with sister.  Awake alert cooperative Chest clear Heart regular Abdomen non-tender  Left lower extremity in knee immobilizer Calf soft Motor and sensory intact distally No pain with passive stretch  No upper extremity or contralateral lower extremity bruising or deformity    Assessment/Plan Assessment:  Left split depressed lateral tibial plateau fracture   Plan: Patient was seen and evaluated as initial assessment. I have mentioned and will ask Dr. Marcelino Scot to help with definitive care. CT scan ordered to better evaluate fracture pattern for surgical planning NPO for now until [facility] determine when they will be able to address her injury SCDs for DVT prophylaxis again no chemoprophylaxis as surgery may occur today versus tomorrow  Questions encouraged answered and reviewed regarding injury treatment plan and generalized post-operative course and expectations.    Pietro Cassis Alvan Dame, MD  05/13/2013, 7:38 AM

## 2013-05-13 NOTE — Progress Notes (Signed)
Orthopedic Tech Progress Note Patient Details:  Meagan Harmon 06-Sep-1960 [id number]  Ortho Devices Ortho Device/Splint Location: trapeze bar patient helper Ortho Device/Splint Interventions: Application   Hildred Priest 05/13/2013, 1:23 PM

## 2013-05-13 NOTE — Consult Note (Signed)
Orthopaedic Trauma Service Consult   Requesting: M. Alvan Dame, MD (ortho) Reason: Left lateral tibial plateau fracture  HPI   Patient is a very pleasant 54 year old white female who sustained a fall yesterday also step stool while she was remodeling her house. The stool was only 2 steps high she missed one step and stepped awkwardly on her left leg. Patient fell to the ground had immediate onset of pain and inability to bear weight on her left leg. She was brought to [facility] for evaluation where she was found to have a left tibial plateau fracture. Patient was admitted to orthopedics as this was an isolated injury, due to the complexity injury the orthopedic trauma service was consult and for definitive management. Again patient was admitted to the orthopedic service on call last night. Patient seen today by the orthopedic trauma service. Currently patient is in room 5 N. 9 she complains only of left knee pain. She denies any injuries elsewhere. No other complaints. She denies any numbness or tingling in her left lower extremity. She denies any motor or sensory dysfunction. No other issues are noted.   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 She does have chronic GERD and is on AcipHex chronically. No other medical or medication related conditions that may contribute to decreased bone density upon review other than that previously noted  Patient does not smoke and is a social drinker  No assistive devices prior to her fall for ambulation   Past Medical History  Diagnosis Date  . Hypertension   . GERD (gastroesophageal reflux disease)   . Hyperlipidemia   . Hiatal hernia   . Anxiety   . Depression   . Diverticulosis    Past Surgical History  Procedure Laterality Date  . Total abdominal hysterectomy    . Nasal sinus surgery     Family History  Problem Relation Age of Onset  . Heart disease Mother     Allergies  Allergen  Reactions  . Sulfonamide Derivatives     REACTION: vomiting   History   Social History  . Marital Status: Married    Spouse Name: N/A    Number of Children: N/A  . Years of Education: N/A   Occupational History  . Not on file.   Social History Main Topics  . Smoking status: Never Smoker   . Smokeless tobacco: Not on file  . Alcohol Use: Yes     Comment: social  . Drug Use: Not on file  . Sexual Activity: Not on file   Other Topics Concern  . 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 Azelastine HCl (ASTEPRO) 0.15 % SOLN Place 2 sprays into both nostrils daily.      [name] BIOTIN PO Take 1 tablet by mouth daily.      [name] buPROPion (WELLBUTRIN) 100 MG tablet Take 100 mg by mouth 2 (two) times daily.      . cyanocobalamin (,VITAMIN B-12,) 1000 MCG/ML injection Inject 1,000 mcg into the muscle daily.      [name] levocetirizine (XYZAL) 5 MG tablet Take 5 mg by mouth at bedtime.      [name] lisinopril (PRINIVIL,ZESTRIL) 10 MG tablet Take 10 mg by mouth daily.      . Misc Natural Products (NARCOSOFT HERBAL LAX)  CAPS Take 2-3 capsules by mouth 2 (two) times daily. Takes 2 capsules in the morning and 3 capsules at night      . RABEprazole (ACIPHEX) 20 MG tablet Take 20 mg by mouth every morning.       . sertraline (ZOLOFT) 100 MG tablet Take 100 mg by mouth 2 (two) times daily.      [name] VITAMIN K PO Take 1 tablet by mouth daily.        Review of Systems  Constitutional: Negative for fever and chills.  Eyes: Negative for blurred vision and double vision.  Respiratory: Negative for cough, shortness of breath and wheezing.   Cardiovascular: Negative for chest pain and palpitations.  Gastrointestinal: Negative for nausea, vomiting and abdominal pain.  Genitourinary: Negative for dysuria and urgency.  Musculoskeletal:       L knee pain   Neurological: Negative for tingling,  sensory change and headaches.    Physical Exam  Physical Exam  Constitutional: She is oriented to person, place, and time. Vital signs are normal. She appears well-developed and well-nourished. She is cooperative. No distress.  HENT:  Head: Normocephalic and atraumatic.  Mouth/Throat: Oropharynx is clear and moist and mucous membranes are normal.  Eyes: EOM are normal. Pupils are equal, round, and reactive to light.  Neck: Normal range of motion and full passive range of motion without pain. No spinous process tenderness and no muscular tenderness present. Normal range of motion present.  Cardiovascular: Normal rate, regular rhythm, S1 normal and S2 normal.   Pulmonary/Chest: Effort normal and breath sounds normal. She has no decreased breath sounds. She has no wheezes. She has no rhonchi. She has no rales.  Abdominal: Soft. Normal appearance and bowel sounds are normal. There is no tenderness.  Musculoskeletal:  Pelvis     No instability or pain with lateral compression or AP compression  Bilateral upper extremities and right lower extremity are without acute findings. Motor and sensory functions are grossly intact. Palpable peripheral pulses are noted. Full active and passive range of motion is noted. Tattoos are noted  Left lower extremity Inspection:   Patient is in a short knee immobilizer   No gross deformities or obvious findings noted to the hip ankle or foot.   No significant ecchymosis to the knee   Mild to moderate left knee effusion  Bony eval:   Nontender to the distal femur, nontender to her patella   Appreciable tenderness to the proximal tibia   Nontender to proximal fibula   Nontender to hip and femoral shaft, nontender to ankle, foot  Soft tissue:    Soft tissue is relatively unremarkable mild swelling is noted to the knee. Skin wrinkles with gentle compression laterally. No fracture blisters are identified. No ecchymosis or lesions are noted.  ROM:     Full  range of motion of the ankle is noted    No range of motion of the knee or hip assessed to fracture of tibial plateau  Sensation:    Deep peroneal nerve, superficial peroneal nerve and tibial nerve sensory functions are intact  Motor:    EHL, FHL, anterior tibialis, posterior tibialis, peroneals and gastrocsoleus complex motor function are intact  Vascular:      Compartments are soft and nontender      Pain with passive stretching      Possible dorsalis pedis and posterior tibialis pulses are noted, 2+    Neurological: She is alert and oriented to person, place, and time.  Skin: Skin  is warm and intact.  Psychiatric: She has a normal mood and affect. Her speech is normal and behavior is normal. Thought content normal. Cognition and memory are normal.     Labs  Results for REKITA, FORESTA (MRN [id number]) as of 05/13/2013 09:31  Ref. Range 05/12/2013 19:15  Sodium Latest Range: 137-147 mEq/L 141  Potassium Latest Range: 3.7-5.3 mEq/L 3.2 (L)  Chloride Latest Range: 96-112 mEq/L 100  CO2 Latest Range: 19-32 mEq/L 26  BUN Latest Range: 6-23 mg/dL 14  Creatinine Latest Range: 0.50-1.10 mg/dL 0.80  Calcium Latest Range: 8.4-10.5 mg/dL 9.5  GFR calc non Af Amer Latest Range: >90 mL/min 83 (L)  GFR calc Af Amer Latest Range: >90 mL/min >90  Glucose Latest Range: 70-99 mg/dL 113 (H)  WBC Latest Range: 4.0-10.5 K/uL 9.7  RBC Latest Range: 3.87-5.11 MIL/uL 3.81 (L)  Hemoglobin Latest Range: 12.0-15.0 g/dL 12.1  HCT Latest Range: 36.0-46.0 % 35.9 (L)  MCV Latest Range: 78.0-100.0 fL 94.2  MCH Latest Range: 26.0-34.0 pg 31.8  MCHC Latest Range: 30.0-36.0 g/dL 33.7  RDW Latest Range: 11.5-15.5 % 13.8  Platelets Latest Range: 150-400 K/uL 216  Neutrophils Relative % Latest Range: 43-77 % 66  Lymphocytes Relative Latest Range: 12-46 % 24  Monocytes Relative Latest Range: 3-12 % 9  Eosinophils Relative Latest Range: 0-5 % 1  Basophils Relative Latest Range: 0-1 % 0  NEUT# Latest  Range: 1.7-7.7 K/uL 6.4  Lymphocytes Absolute Latest Range: 0.7-4.0 K/uL 2.4  Monocytes Absolute Latest Range: 0.1-1.0 K/uL 0.9  Eosinophils Absolute Latest Range: 0.0-0.7 K/uL 0.1  Basophils Absolute Latest Range: 0.0-0.1 K/uL 0.0    Imaging  Xray and CT L knee: split depression lateral tibial plateau fracture   Assessment and Plan   53 y/o female s/p fall off step stool with split depression L tibial plateau fracture  1: Left lateral tibial plateau fracture, split depression, Schatzker 2, OTA 41-B3  There is significant depression present on the CAT scan as well as on the x-ray. The patient will need surgery to restore alignment, stability, joint surface congruity as well as to evaluate her meniscus which may be caught in the fracture site.  Plan to proceed to the [place] for now, patient can be up to the chair she desires  Nonweightbearing left leg  Patient will be nonweightbearing for 6 weeks postoperatively as well  She'll have unrestricted range of motion postoperatively and we will get her fitted for a hinged knee brace  Continue with aggressive ice and elevation  Patient has very minimal swelling present at current time  We'll have an Ace wrap applied from foot to thigh  2. Pain management:  Will adjust pain medicine  Start Percocet and OxyIR for breakthrough pain  Increase Robaxin to [phone] every 6 hours as needed for spasms  3. ABL anemia/Hemodynamics  Type and screen  CBC in the morning  4. DVT/PE prophylaxis:  SCDs for now  Lovenox for 2 weeks postoperatively but will monitor closely do to gastric ulcers  5. Metabolic Bone Disease:  This was a relatively low energy mechanism, will check metabolic bone labs to evaluate for overall bone health and to identify any correctable factors  6. Activity:  As per #1  7. FEN/Foley/Lines:  Regular diet for now  N.p.o. after midnight  A peripheral IV has been started,  will continue with gentle IV  fluids   8. medical issues  Reconciled home meds  Have held lisinopril for now as her blood  pressure is controlled. Will monitor postoperatively and restart if needed  9. Dispo:  OR tomorrow for ORIF left tibial plateau  Anticipate 2 to three-day hospital stay postoperatively with plan to discharge to home  Jari Pigg, PA-C [facility] [phone] (P) 05/13/2013 10:11 AM

## 2013-05-13 NOTE — Progress Notes (Signed)
PHARMACIST - PHYSICIAN ORDER COMMUNICATION  CONCERNING: P&T Medication Policy on Herbal Medications  DESCRIPTION:  This patient's order for B12 injection  has been noted.  This product(s) is classified as an "herbal" or natural product (patient buys off the internet, Does not have an outpatient prescription for B12). Due to a lack of definitive safety studies or FDA approval, nonstandard manufacturing practices, plus the potential risk of unknown drug-drug interactions while on inpatient medications, the Pharmacy and Therapeutics Committee does not permit the use of "herbal" or natural products of this type within [facility].   ACTION TAKEN: The pharmacy department is unable to verify this order at this time and your patient has been informed of this safety policy. Please reevaluate patient's clinical condition at discharge and address if the herbal or natural product(s) should be resumed at that time.  Patient is agreeable to holding home B12 injections while in hospital  Sheral Pfahler S. Alford Highland, PharmD, [place] Clinical Staff Pharmacist Pager [phone]

## 2013-05-14 ENCOUNTER — Inpatient Hospital Stay (HOSPITAL_COMMUNITY): Payer: BC Managed Care – PPO

## 2013-05-14 ENCOUNTER — Encounter (HOSPITAL_COMMUNITY): Payer: Self-pay | Admitting: *Deleted

## 2013-05-14 ENCOUNTER — Encounter (HOSPITAL_COMMUNITY)
Admission: EM | Disposition: A | Discharge status: Home Health | Payer: Self-pay | Source: Home / Self Care | Attending: Orthopedic Surgery

## 2013-05-14 ENCOUNTER — Encounter (HOSPITAL_COMMUNITY): Payer: BC Managed Care – PPO | Admitting: Certified Registered"

## 2013-05-14 ENCOUNTER — Inpatient Hospital Stay (HOSPITAL_COMMUNITY): Payer: BC Managed Care – PPO | Admitting: Certified Registered"

## 2013-05-14 HISTORY — PX: ORIF TIBIA PLATEAU: SHX2132

## 2013-05-14 LAB — APTT: aPTT: 27 seconds (ref 24–37)

## 2013-05-14 LAB — COMPREHENSIVE METABOLIC PANEL
ALT: 62 U/L — ABNORMAL HIGH (ref 0–35)
AST: 78 U/L — ABNORMAL HIGH (ref 0–37)
Albumin: 3 g/dL — ABNORMAL LOW (ref 3.5–5.2)
Alkaline Phosphatase: 136 U/L — ABNORMAL HIGH (ref 39–117)
BUN: 11 mg/dL (ref 6–23)
CALCIUM: 8.8 mg/dL (ref 8.4–10.5)
CHLORIDE: 104 meq/L (ref 96–112)
CO2: 28 mEq/L (ref 19–32)
CREATININE: 0.81 mg/dL (ref 0.50–1.10)
GFR, EST NON AFRICAN AMERICAN: 82 mL/min — AB (ref >90)
GLUCOSE: 102 mg/dL — AB (ref 70–99)
Potassium: 3.8 mEq/L (ref 3.7–5.3)
Sodium: 144 mEq/L (ref 137–147)
Total Bilirubin: 0.4 mg/dL (ref 0.3–1.2)
Total Protein: 7 g/dL (ref 6.0–8.3)

## 2013-05-14 LAB — CBC
HEMATOCRIT: 31.8 % — AB (ref 36.0–46.0)
Hemoglobin: 10.9 g/dL — ABNORMAL LOW (ref 12.0–15.0)
MCH: 32.4 pg (ref 26.0–34.0)
MCHC: 34.3 g/dL (ref 30.0–36.0)
MCV: 94.6 fL (ref 78.0–100.0)
PLATELETS: 177 10*3/uL (ref 150–400)
RBC: 3.36 MIL/uL — ABNORMAL LOW (ref 3.87–5.11)
RDW: 14.4 % (ref 11.5–15.5)
WBC: 5.7 10*3/uL (ref 4.0–10.5)

## 2013-05-14 LAB — TYPE AND SCREEN
ABO/RH(D): O POS
Antibody Screen: NEGATIVE

## 2013-05-14 LAB — ABO/RH: ABO/RH(D): O POS

## 2013-05-14 LAB — PROTIME-INR
INR: 0.97 (ref 0.00–1.49)
Prothrombin Time: 12.7 seconds (ref 11.6–15.2)

## 2013-05-14 LAB — VITAMIN D 25 HYDROXY (VIT D DEFICIENCY, FRACTURES): Vit D, 25-Hydroxy: 76 ng/mL (ref 30–89)

## 2013-05-14 SURGERY — OPEN REDUCTION INTERNAL FIXATION (ORIF) TIBIAL PLATEAU
Anesthesia: General | Site: Leg Lower | Laterality: Left

## 2013-05-14 MED ORDER — MORPHINE SULFATE (PF) 1 MG/ML IV SOLN
INTRAVENOUS | Status: DC
  Administered 2013-05-14: 17:00:00 via INTRAVENOUS
  Administered 2013-05-14: 13 mg via INTRAVENOUS
  Administered 2013-05-14: 23:00:00 via INTRAVENOUS
  Administered 2013-05-15: 8 mg via INTRAVENOUS
  Administered 2013-05-15: 9 mg via INTRAVENOUS
  Administered 2013-05-15: 11 mg via INTRAVENOUS
  Administered 2013-05-15: 9.68 mg via INTRAVENOUS
  Administered 2013-05-15: 3 mg via INTRAVENOUS
  Administered 2013-05-15: 12:00:00 via INTRAVENOUS
  Administered 2013-05-15: 10 mg via INTRAVENOUS
  Administered 2013-05-15: 22:00:00 via INTRAVENOUS
  Administered 2013-05-16: 10 mg via INTRAVENOUS
  Administered 2013-05-16: 7 mg via INTRAVENOUS
  Filled 2013-05-14 (×4): qty 25

## 2013-05-14 MED ORDER — ONDANSETRON HCL 4 MG/2ML IJ SOLN
4.0000 mg | Freq: Four times a day (QID) | INTRAMUSCULAR | Status: DC | PRN
Start: 2013-05-14 — End: 2013-05-17
  Filled 2013-05-14: qty 2

## 2013-05-14 MED ORDER — DIPHENHYDRAMINE HCL 12.5 MG/5ML PO ELIX: 12.5000 mg | ORAL_SOLUTION | Freq: Four times a day (QID) | ORAL | Status: DC | PRN

## 2013-05-14 MED ORDER — LIDOCAINE HCL (CARDIAC) 20 MG/ML IV SOLN
INTRAVENOUS | Status: DC | PRN
  Administered 2013-05-14: 60 mg via INTRAVENOUS

## 2013-05-14 MED ORDER — ROCURONIUM BROMIDE 100 MG/10ML IV SOLN
INTRAVENOUS | Status: DC | PRN
  Administered 2013-05-14: 40 mg via INTRAVENOUS
  Administered 2013-05-14: 10 mg via INTRAVENOUS

## 2013-05-14 MED ORDER — MIDAZOLAM HCL 5 MG/5ML IJ SOLN
INTRAMUSCULAR | Status: DC | PRN
  Administered 2013-05-14: 2 mg via INTRAVENOUS

## 2013-05-14 MED ORDER — SODIUM CHLORIDE 0.9 % IJ SOLN: 9.0000 mL | INTRAMUSCULAR | Status: DC | PRN

## 2013-05-14 MED ORDER — PROPOFOL 10 MG/ML IV BOLUS
INTRAVENOUS | Status: AC
  Filled 2013-05-14: qty 20

## 2013-05-14 MED ORDER — FENTANYL CITRATE 0.05 MG/ML IJ SOLN
INTRAMUSCULAR | Status: DC | PRN
  Administered 2013-05-14: 50 ug via INTRAVENOUS
  Administered 2013-05-14: 100 ug via INTRAVENOUS

## 2013-05-14 MED ORDER — FENTANYL CITRATE 0.05 MG/ML IJ SOLN
INTRAMUSCULAR | Status: AC
  Filled 2013-05-14: qty 5

## 2013-05-14 MED ORDER — NALOXONE HCL 0.4 MG/ML IJ SOLN: 0.4000 mg | INTRAMUSCULAR | Status: DC | PRN

## 2013-05-14 MED ORDER — HYDROMORPHONE HCL PF 1 MG/ML IJ SOLN
0.2500 mg | INTRAMUSCULAR | Status: DC | PRN
  Administered 2013-05-14 (×4): 0.5 mg via INTRAVENOUS

## 2013-05-14 MED ORDER — HYDROMORPHONE HCL PF 1 MG/ML IJ SOLN
INTRAMUSCULAR | Status: AC
  Administered 2013-05-14: 13:00:00
  Filled 2013-05-14: qty 1

## 2013-05-14 MED ORDER — CEFAZOLIN SODIUM 1-5 GM-% IV SOLN
1.0000 g | Freq: Four times a day (QID) | INTRAVENOUS | Status: AC
  Administered 2013-05-14 – 2013-05-15 (×3): 1 g via INTRAVENOUS
  Filled 2013-05-14 (×3): qty 50

## 2013-05-14 MED ORDER — ENOXAPARIN SODIUM 40 MG/0.4ML ~~LOC~~ SOLN
40.0000 mg | SUBCUTANEOUS | Status: DC
  Administered 2013-05-14 – 2013-05-17 (×4): 40 mg via SUBCUTANEOUS
  Filled 2013-05-14 (×4): qty 0.4

## 2013-05-14 MED ORDER — ONDANSETRON HCL 4 MG/2ML IJ SOLN
INTRAMUSCULAR | Status: DC | PRN
  Administered 2013-05-14: 4 mg via INTRAVENOUS

## 2013-05-14 MED ORDER — GLYCOPYRROLATE 0.2 MG/ML IJ SOLN
INTRAMUSCULAR | Status: DC | PRN
  Administered 2013-05-14 (×2): 0.4 mg via INTRAVENOUS

## 2013-05-14 MED ORDER — ONDANSETRON HCL 4 MG/2ML IJ SOLN: 4.0000 mg | Freq: Four times a day (QID) | INTRAMUSCULAR | Status: DC | PRN

## 2013-05-14 MED ORDER — EPHEDRINE SULFATE 50 MG/ML IJ SOLN
INTRAMUSCULAR | Status: DC | PRN
  Administered 2013-05-14: 10 mg via INTRAVENOUS

## 2013-05-14 MED ORDER — 0.9 % SODIUM CHLORIDE (POUR BTL) OPTIME
TOPICAL | Status: DC | PRN
  Administered 2013-05-14: 1000 mL

## 2013-05-14 MED ORDER — POTASSIUM CHLORIDE IN NACL 20-0.9 MEQ/L-% IV SOLN
INTRAVENOUS | Status: DC
  Administered 2013-05-14: 50 mL/h via INTRAVENOUS
  Filled 2013-05-14 (×3): qty 1000

## 2013-05-14 MED ORDER — HYDROMORPHONE HCL PF 1 MG/ML IJ SOLN
INTRAMUSCULAR | Status: AC
  Administered 2013-05-14: 14:00:00
  Filled 2013-05-14: qty 1

## 2013-05-14 MED ORDER — DIPHENHYDRAMINE HCL 50 MG/ML IJ SOLN: 12.5000 mg | Freq: Four times a day (QID) | INTRAMUSCULAR | Status: DC | PRN

## 2013-05-14 MED ORDER — PROPOFOL 10 MG/ML IV BOLUS
INTRAVENOUS | Status: DC | PRN
  Administered 2013-05-14: 200 mg via INTRAVENOUS

## 2013-05-14 MED ORDER — NEOSTIGMINE METHYLSULFATE 1 MG/ML IJ SOLN
INTRAMUSCULAR | Status: DC | PRN
  Administered 2013-05-14 (×2): 3 mg via INTRAVENOUS

## 2013-05-14 MED ORDER — MIDAZOLAM HCL 2 MG/2ML IJ SOLN
INTRAMUSCULAR | Status: AC
  Filled 2013-05-14: qty 2

## 2013-05-14 MED ORDER — METOCLOPRAMIDE HCL 5 MG PO TABS
5.0000 mg | ORAL_TABLET | Freq: Three times a day (TID) | ORAL | Status: DC | PRN
  Filled 2013-05-14: qty 2

## 2013-05-14 MED ORDER — ONDANSETRON HCL 4 MG PO TABS: 4.0000 mg | ORAL_TABLET | Freq: Four times a day (QID) | ORAL | Status: DC | PRN

## 2013-05-14 MED ORDER — METOCLOPRAMIDE HCL 5 MG/ML IJ SOLN: 5.0000 mg | Freq: Three times a day (TID) | INTRAMUSCULAR | Status: DC | PRN

## 2013-05-14 SURGICAL SUPPLY — 88 items
BANDAGE ELASTIC 4 VELCRO ST LF (GAUZE/BANDAGES/DRESSINGS) ×3 IMPLANT
BANDAGE ELASTIC 6 VELCRO ST LF (GAUZE/BANDAGES/DRESSINGS) ×3 IMPLANT
BANDAGE ESMARK 6X9 LF (GAUZE/BANDAGES/DRESSINGS) ×1 IMPLANT
BANDAGE GAUZE ELAST BULKY 4 IN (GAUZE/BANDAGES/DRESSINGS) ×3 IMPLANT
BIT DRILL 100X2.5XANTM LCK (BIT) ×1 IMPLANT
BIT DRILL CAL (BIT) ×1 IMPLANT
BIT DRL 100X2.5XANTM LCK (BIT) ×1
BLADE SURG 10 STRL SS (BLADE) ×3 IMPLANT
BLADE SURG 15 STRL LF DISP TIS (BLADE) ×1 IMPLANT
BLADE SURG 15 STRL SS (BLADE) ×2
BLADE SURG ROTATE 9660 (MISCELLANEOUS) IMPLANT
BNDG COHESIVE 4X5 TAN STRL (GAUZE/BANDAGES/DRESSINGS) ×3 IMPLANT
BNDG ESMARK 6X9 LF (GAUZE/BANDAGES/DRESSINGS) ×3
BNDG GAUZE ELAST 4 BULKY (GAUZE/BANDAGES/DRESSINGS) ×3 IMPLANT
BRUSH SCRUB DISP (MISCELLANEOUS) ×6 IMPLANT
CANISTER SUCT 3000ML (MISCELLANEOUS) ×3 IMPLANT
COVER MAYO STAND STRL (DRAPES) ×3 IMPLANT
COVER SURGICAL LIGHT HANDLE (MISCELLANEOUS) ×3 IMPLANT
DRAPE C-ARM 42X72 X-RAY (DRAPES) ×3 IMPLANT
DRAPE C-ARMOR (DRAPES) ×3 IMPLANT
DRAPE INCISE IOBAN 66X45 STRL (DRAPES) ×3 IMPLANT
DRAPE ORTHO SPLIT 77X108 STRL (DRAPES)
DRAPE SURG ORHT 6 SPLT 77X108 (DRAPES) IMPLANT
DRAPE TABLE COVER HEAVY DUTY (DRAPES) ×3 IMPLANT
DRAPE U-SHAPE 47X51 STRL (DRAPES) ×3 IMPLANT
DRILL BIT 2.5MM (BIT) ×2
DRILL BIT CAL (BIT) ×3
DRSG ADAPTIC 3X8 NADH LF (GAUZE/BANDAGES/DRESSINGS) ×3 IMPLANT
DRSG PAD ABDOMINAL 8X10 ST (GAUZE/BANDAGES/DRESSINGS) ×12 IMPLANT
ELECT REM PT RETURN 9FT ADLT (ELECTROSURGICAL) ×3
ELECTRODE REM PT RTRN 9FT ADLT (ELECTROSURGICAL) ×1 IMPLANT
EVACUATOR 1/8 PVC DRAIN (DRAIN) IMPLANT
EVACUATOR 3/16  PVC DRAIN (DRAIN)
EVACUATOR 3/16 PVC DRAIN (DRAIN) IMPLANT
GLOVE BIO SURGEON STRL SZ7.5 (GLOVE) ×3 IMPLANT
GLOVE BIO SURGEON STRL SZ8 (GLOVE) ×3 IMPLANT
GLOVE BIOGEL PI IND STRL 7.5 (GLOVE) ×1 IMPLANT
GLOVE BIOGEL PI IND STRL 8 (GLOVE) ×1 IMPLANT
GLOVE BIOGEL PI INDICATOR 7.5 (GLOVE) ×2
GLOVE BIOGEL PI INDICATOR 8 (GLOVE) ×2
GOWN STRL REUS W/ TWL LRG LVL3 (GOWN DISPOSABLE) ×2 IMPLANT
GOWN STRL REUS W/ TWL XL LVL3 (GOWN DISPOSABLE) ×1 IMPLANT
GOWN STRL REUS W/TWL LRG LVL3 (GOWN DISPOSABLE) ×4
GOWN STRL REUS W/TWL XL LVL3 (GOWN DISPOSABLE) ×2
IMMOBILIZER KNEE 22 UNIV (SOFTGOODS) ×3 IMPLANT
K-WIRE ACE 1.6X6 (WIRE) ×6
KIT BASIN OR (CUSTOM PROCEDURE TRAY) ×3 IMPLANT
KIT ROOM TURNOVER OR (KITS) ×3 IMPLANT
KWIRE ACE 1.6X6 (WIRE) ×2 IMPLANT
NDL SUT 6 .5 CRC .975X.05 MAYO (NEEDLE) IMPLANT
NEEDLE 1/2 CIR MAYO (NEEDLE) ×3 IMPLANT
NEEDLE 22X1 1/2 (OR ONLY) (NEEDLE) IMPLANT
NEEDLE MAYO TAPER (NEEDLE)
NS IRRIG 1000ML POUR BTL (IV SOLUTION) ×3 IMPLANT
PACK ORTHO EXTREMITY (CUSTOM PROCEDURE TRAY) ×3 IMPLANT
PAD ARMBOARD 7.5X6 YLW CONV (MISCELLANEOUS) ×6 IMPLANT
PAD CAST 4YDX4 CTTN HI CHSV (CAST SUPPLIES) ×1 IMPLANT
PADDING CAST COTTON 4X4 STRL (CAST SUPPLIES) ×2
PADDING CAST COTTON 6X4 STRL (CAST SUPPLIES) ×3 IMPLANT
PLATE LOCK 3H STD LT PROX TIB (Plate) ×3 IMPLANT
PUTTY GAMMA BSM 5CC (Putty) ×3 IMPLANT
SCREW CORTICAL 3.5MM 36MM (Screw) ×3 IMPLANT
SCREW LOCK 3.5X65 DIST TIB (Screw) ×3 IMPLANT
SCREW LOCK CORT STAR 3.5X60 (Screw) ×6 IMPLANT
SCREW LOCK CORT STAR 3.5X65 (Screw) ×3 IMPLANT
SCREW LOCK CORT STAR 3.5X70 (Screw) ×3 IMPLANT
SCREW LP 3.5X70MM (Screw) ×6 IMPLANT
SPONGE GAUZE 4X4 12PLY (GAUZE/BANDAGES/DRESSINGS) ×3 IMPLANT
SPONGE LAP 18X18 X RAY DECT (DISPOSABLE) ×3 IMPLANT
STAPLER VISISTAT 35W (STAPLE) ×3 IMPLANT
STOCKINETTE IMPERVIOUS LG (DRAPES) ×3 IMPLANT
SUCTION FRAZIER TIP 10 FR DISP (SUCTIONS) ×3 IMPLANT
SUT ETHILON 3 0 PS 1 (SUTURE) ×3 IMPLANT
SUT PROLENE 0 CT 2 (SUTURE) ×6 IMPLANT
SUT VIC AB 0 CT1 27 (SUTURE) ×2
SUT VIC AB 0 CT1 27XBRD ANBCTR (SUTURE) ×1 IMPLANT
SUT VIC AB 1 CT1 27 (SUTURE) ×3
SUT VIC AB 1 CT1 27XBRD ANBCTR (SUTURE) ×1 IMPLANT
SUT VIC AB 2-0 CT1 27 (SUTURE) ×4
SUT VIC AB 2-0 CT1 TAPERPNT 27 (SUTURE) ×2 IMPLANT
SYR 20ML ECCENTRIC (SYRINGE) IMPLANT
TOWEL OR 17X24 6PK STRL BLUE (TOWEL DISPOSABLE) ×3 IMPLANT
TOWEL OR 17X26 10 PK STRL BLUE (TOWEL DISPOSABLE) ×6 IMPLANT
TRAY FOLEY CATH 16FRSI W/METER (SET/KITS/TRAYS/PACK) IMPLANT
TUBE CONNECTING 12'X1/4 (SUCTIONS) ×1
TUBE CONNECTING 12X1/4 (SUCTIONS) ×2 IMPLANT
WATER STERILE IRR 1000ML POUR (IV SOLUTION) ×6 IMPLANT
YANKAUER SUCT BULB TIP NO VENT (SUCTIONS) ×3 IMPLANT

## 2013-05-14 NOTE — Anesthesia Procedure Notes (Signed)
Procedure Name: Intubation Date/Time: 05/14/2013 10:40 AM Performed by: Manuela Schwartz B Pre-anesthesia Checklist: Patient identified, Emergency Drugs available, Suction available, Patient being monitored and Timeout performed Patient Re-evaluated:Patient Re-evaluated prior to inductionOxygen Delivery Method: Circle system utilized Preoxygenation: Pre-oxygenation with 100% oxygen Intubation Type: IV induction Ventilation: Mask ventilation without difficulty Laryngoscope Size: Mac and 3 Grade View: Grade I Tube type: Oral Tube size: 7.5 mm Number of attempts: 1 Airway Equipment and Method: Stylet Placement Confirmation: ETT inserted through vocal cords under direct vision,  positive ETCO2 and breath sounds checked- equal and bilateral Secured at: 22 cm Tube secured with: Tape Dental Injury: Teeth and Oropharynx as per pre-operative assessment

## 2013-05-14 NOTE — Op Note (Signed)
NAMEMarland Harmon  KALEYAH, LYKES NO.:  [id number]  MEDICAL RECORD NO.:  [id number]  LOCATION:  [id number]                        FACILITY:  [facility]  PHYSICIAN:  Astrid Divine. Marcelino Scot, M.D. DATE OF BIRTH:  1960-06-12  DATE OF PROCEDURE:  05/14/2013 DATE OF DISCHARGE:                              OPERATIVE REPORT   PREOPERATIVE DIAGNOSIS:  Left lateral tibial plateau fracture.  POSTOPERATIVE DIAGNOSIS:  Left lateral tibial plateau fracture.  PROCEDURE:  Open reduction and internal fixation, left lateral tibial plateau.  SURGEON:  Astrid Divine. Marcelino Scot, MD  ASSISTANT:  None.  FINDINGS:  Intact lateral meniscus.  TOTAL TOURNIQUET TIME:  45 minutes.  DISPOSITION:  To PACU.  CONDITION:  Stable.  BRIEF SUMMARY OF INDICATIONS FOR PROCEDURE:  Meagan Harmon is a very pleasant 53 year old female who sustained a depressed lateral tibial plateau fracture.  I discussed with her preoperative risks and benefits of the surgery including possibility of infection, nerve injury, vessel injury, DVT, PE, heart attack, stroke, arthritis, loss of motion, need for further surgery, and many others.  She understood these and did wish to proceed.  DESCRIPTION OF PROCEDURE:  The patient received preoperative antibiotics, taken to the operating room where general anesthesia was induced.  A Foley catheter was placed and this eventually returned 500 mL over the course of the procedure.  A tourniquet was placed about the thigh and after standard prep and drape, the tourniquet was left deflated initially.  Standard lateral approach, curving over Gerdy tubercle posteriorly was made.  Dissection was carried down to the anterior compartment which was released near its insertion on the proximal tibia.  The arthrotomy was made superior to the joint line. Coronary ligament incised along its insertion to the proximal tibia.  As a result, reflected proximally.  It was evaluated and found to be intact and without  significant injury.  The articular surface was largely one block and was considerably impacted well over a 2 cm.  A trapdoor was made in the lateral metaphysis with the hinge posteriorly as it was book opened.  The tamp was advanced into the subarticular region and identified with x-ray assistance.  The lateral plateau was then sequentially elevated into a reduced position as a gauge on both AP and lateral images.  At that time, the leg was exsanguinated with an Esmarch bandage, tourniquet inflated to allow for placement of calcium phosphate cement in an optimum environment.  The metaphyseal defect and subarticular defect were irrigated thoroughly and then 5 mL of gamma-bsm or Etex calcium phosphate cement was placed into the area.  Trapdoor was closed and the plate affixed to the side.  It was pinned provisionally, checked for position, and then standard screws placed in lag fashion across the articular surface, followed by additional lock screws, a single screw in the proximal metaphysis was placed.  Final images were checked to ensure appropriate reduction, alignment, hardware placement, trajectory, and length.  The wound was irrigated thoroughly and then closed in standard layered fashion using #1 figure-of-eight for the IT band and arthrotomy using a 0 Prolene vertical mattress suture to repair the coronary ligament adjacent to the lateral meniscus and then 2-0 Vicryl and 3-0 nylon for the  subcu and skin.  Sterile gently compressive dressing was applied and then a knee immobilizer until the patient was adequately risen from anesthesia to begin knee motion.  PROGNOSIS:  [name] will be nonweightbearing on the left lower extremity for the next 6 weeks with graduated weightbearing thereafter. She will be on DVT prophylaxis pharmacologically while in the hospital and for 10 days after discharge with Lovenox anticipated at this time. We also will discuss with her further workup of her  elevated LFTs.     Astrid Divine. Marcelino Scot, M.D.     MHH/MEDQ  D:  05/14/2013  T:  05/14/2013  Job:  [id number]

## 2013-05-14 NOTE — Consult Note (Addendum)
I have seen and examined the patient. I agree with the findings above. Given the complexity of the fracture pattern, Dr. Alvan Dame asserted this was outside his scope of practice and that it would be in the best interest of the patient to have these injuries evaluated and treated by a fellowship trained orthopaedic traumatologist.   Left lateral depressed tibial plateau fracture No sensory or motor loss Skin wrinkles in area of incision  Plan ORIF with grafting or more likely calcium phosphate cement  I discussed with the patient the risks and benefits of surgery, including the possibility of infection, nerve injury, vessel injury, wound breakdown, arthritis, symptomatic hardware, DVT/ PE, loss of motion, and need for further surgery among others.  We also specifically discussed the elevated risk of soft tissue breakdown that could lead to amputation.  She understood these risks and wished to proceed.   Rozanna Box, MD 05/14/2013 10:16 AM

## 2013-05-14 NOTE — Anesthesia Preprocedure Evaluation (Signed)
Anesthesia Evaluation  Patient identified by MRN, date of birth, ID band Patient awake    Reviewed: Allergy & Precautions, H&P , NPO status , Patient's Chart, lab work & pertinent test results  Airway Mallampati: II      Dental   Pulmonary neg pulmonary ROS,  breath sounds clear to auscultation        Cardiovascular hypertension, Rhythm:Regular Rate:Normal     Neuro/Psych    GI/Hepatic Neg liver ROS, hiatal hernia, GERD-  ,  Endo/Other    Renal/GU negative Renal ROS     Musculoskeletal   Abdominal   Peds  Hematology   Anesthesia Other Findings   Reproductive/Obstetrics                           Anesthesia Physical Anesthesia Plan  ASA: III  Anesthesia Plan: General   Post-op Pain Management:    Induction: Intravenous  Airway Management Planned: Oral ETT  Additional Equipment:   Intra-op Plan:   Post-operative Plan: Extubation in OR  Informed Consent: I have reviewed the patients History and Physical, chart, labs and discussed the procedure including the risks, benefits and alternatives for the proposed anesthesia with the patient or authorized representative who has indicated his/her understanding and acceptance.   Dental advisory given  Plan Discussed with: CRNA, Anesthesiologist and Surgeon  Anesthesia Plan Comments:         Anesthesia Quick Evaluation

## 2013-05-14 NOTE — Progress Notes (Addendum)
Pt continues to complain of pain in the 7-9 range while using the morphine PCA. PA Chadwell notified and ordered one time dose of Oxycodone 10mg  at hs for additional pain management. Pt's oxygen sats remain at 99%. Husband remains at the bedside./  Pt slept well after taking the oxycodone.

## 2013-05-14 NOTE — Transfer of Care (Signed)
Immediate Anesthesia Transfer of Care Note  Patient: Meagan Harmon  Procedure(s) Performed: Procedure(s): LEFT OPEN REDUCTION INTERNAL FIXATION (ORIF) TIBIAL PLATEAU  (Left)  Patient Location: PACU  Anesthesia Type:General  Level of Consciousness: awake, alert  and patient cooperative  Airway & Oxygen Therapy: Patient Spontanous Breathing and Patient connected to nasal cannula oxygen  Post-op Assessment: Report given to PACU RN and Post -op Vital signs reviewed and stable  Post vital signs: Reviewed and stable  Complications: No apparent anesthesia complications

## 2013-05-14 NOTE — Brief Op Note (Signed)
05/12/2013 - 05/14/2013  12:57 PM  PATIENT:  Meagan Harmon  53 y.o. female  PRE-OPERATIVE DIAGNOSIS:  LEFT LATERAL TIBIAL PLATEAU FRACTURE  POST-OPERATIVE DIAGNOSIS:  LEFT LATERAL TIBIAL PLATEAU FRACTURE  PROCEDURE:  Procedure(s): LEFT OPEN REDUCTION INTERNAL FIXATION (ORIF) TIBIAL PLATEAU  (Left)  SURGEON:  Surgeon(s) and Role:    * Rozanna Box, MD - Primary  PHYSICIAN ASSISTANT: None  ANESTHESIA:   general  I/O:  Total I/O In: 1500 [I.V.:1500] Out: 650 [Urine:500; Blood:150]  SPECIMEN:  No Specimen  TOURNIQUET:   Total Tourniquet Time Documented: Thigh (Left) - 45 minutes Total: Thigh (Left) - 45 minutes   DICTATION: .Other Dictation: Dictation Number [id number]

## 2013-05-14 NOTE — Anesthesia Postprocedure Evaluation (Signed)
  Anesthesia Post-op Note  Patient: Meagan Harmon  Procedure(s) Performed: Procedure(s): LEFT OPEN REDUCTION INTERNAL FIXATION (ORIF) TIBIAL PLATEAU  (Left)  Patient Location: PACU  Anesthesia Type:General  Level of Consciousness: awake  Airway and Oxygen Therapy: Patient Spontanous Breathing  Post-op Pain: mild  Post-op Assessment: Post-op Vital signs reviewed  Post-op Vital Signs: Reviewed  Complications: No apparent anesthesia complications

## 2013-05-14 NOTE — Progress Notes (Signed)
Orthopedic Tech Progress Note Patient Details:  Meagan Harmon 1960-07-19 [id number] [facility] called for brace order Patient ID: Meagan Harmon, female   DOB: [birth year], 53 y.o.   MRN: [id number]   Meagan Harmon 05/14/2013, 2:41 PM

## 2013-05-15 ENCOUNTER — Encounter (HOSPITAL_COMMUNITY): Payer: Self-pay | Admitting: Orthopedic Surgery

## 2013-05-15 LAB — CBC
HCT: 33.9 % — ABNORMAL LOW (ref 36.0–46.0)
HEMOGLOBIN: 11.5 g/dL — AB (ref 12.0–15.0)
MCH: 31.8 pg (ref 26.0–34.0)
MCHC: 33.9 g/dL (ref 30.0–36.0)
MCV: 93.6 fL (ref 78.0–100.0)
Platelets: 195 10*3/uL (ref 150–400)
RBC: 3.62 MIL/uL — AB (ref 3.87–5.11)
RDW: 13.6 % (ref 11.5–15.5)
WBC: 10.4 10*3/uL (ref 4.0–10.5)

## 2013-05-15 LAB — BASIC METABOLIC PANEL
BUN: 7 mg/dL (ref 6–23)
CALCIUM: 8.7 mg/dL (ref 8.4–10.5)
CO2: 28 mEq/L (ref 19–32)
Chloride: 96 mEq/L (ref 96–112)
Creatinine, Ser: 0.67 mg/dL (ref 0.50–1.10)
GFR calc non Af Amer: >90 mL/min (ref >90)
Glucose, Bld: 121 mg/dL — ABNORMAL HIGH (ref 70–99)
POTASSIUM: 3.8 meq/L (ref 3.7–5.3)
SODIUM: 134 meq/L — AB (ref 137–147)

## 2013-05-15 MED ORDER — POLYETHYLENE GLYCOL 3350 17 G PO PACK
17.0000 g | PACK | Freq: Every day | ORAL | Status: DC
  Administered 2013-05-15 – 2013-05-16 (×2): 17 g via ORAL
  Filled 2013-05-15 (×5): qty 1

## 2013-05-15 MED ORDER — DOCUSATE SODIUM 100 MG PO CAPS
100.0000 mg | ORAL_CAPSULE | Freq: Two times a day (BID) | ORAL | Status: DC
  Administered 2013-05-15 – 2013-05-16 (×3): 100 mg via ORAL
  Filled 2013-05-15 (×4): qty 1

## 2013-05-15 MED ORDER — LISINOPRIL 10 MG PO TABS
10.0000 mg | ORAL_TABLET | Freq: Every day | ORAL | Status: DC
  Administered 2013-05-15: 10 mg via ORAL
  Filled 2013-05-15 (×3): qty 1

## 2013-05-15 NOTE — Progress Notes (Signed)
Physical Therapy Treatment Patient Details Name: Meagan Harmon MRN: [id number] DOB: 1960/06/18 Today's Date: 05/15/2013 Time: 1337-1400 PT Time Calculation (min): 23 min  PT Assessment / Plan / Recommendation  History of Present Illness pt reports new pain medicine is helping her better   PT Comments   Pt better able to control her body and maintain NWB with RW.  Follow Up Recommendations  Home health PT     Does the patient have the potential to tolerate intense rehabilitation     Barriers to Discharge Inaccessible home environment husband says he was a Runner, broadcasting/film/video in [place] and has a friend who can help lift pt up the steps in a wheelchair    Equipment Recommendations  Wheelchair (measurements PT);Wheelchair cushion (measurements PT) Photographer)    Recommendations for Other Services OT consult  Frequency 7X/week   Progress towards PT Goals Progress towards PT goals: Progressing toward goals  Plan Current plan remains appropriate    Precautions / Restrictions Precautions Required Braces or Orthoses: Other Brace/Splint Other Brace/Splint: bilateral upright hinged knee brace Restrictions Weight Bearing Restrictions: Yes LLE Weight Bearing: Non weight bearing   Pertinent Vitals/Pain Pain improved with new medication    Mobility  Bed Mobility Overal bed mobility: Needs Assistance Bed Mobility: Sit to Supine Supine to sit: Mod assist General bed mobility comments: pt is limited by pain  needs assis to move leg Transfers Overall transfer level: Needs assistance Equipment used: Rolling walker (2 wheeled) Transfers: Sit to/from [employer] Sit to Stand: +2 physical assistance;Mod assist Stand pivot transfers: +2 physical assistance;Mod assist General transfer comment: cues to stand, lift left heel up and pivot back to bed Ambulation/Gait Ambulation/Gait assistance: Mod assist Ambulation Distance (Feet): 2 Feet Assistive device: Rolling walker (2  wheeled) General Gait Details: pt was able to push up on arms and take a step with right leg, maintaining NWB on LLE    Exercises Other Exercises Other Exercises: PROM to left knee flexion /extension in the brace x 10 reps   PT Diagnosis: Acute pain;Difficulty walking  PT Problem List: Pain;Decreased activity tolerance;Decreased mobility;Decreased strength PT Treatment Interventions: Functional mobility training;Therapeutic activities;Therapeutic exercise;Patient/family education   PT Goals (current goals can now be found in the care plan section) Acute Rehab PT Goals Patient Stated Goal: to go home  PT Goal Formulation: With patient/family Time For Goal Achievement: 05/22/13 Potential to Achieve Goals: Good  Visit Information  Last PT Received On: 05/15/13 Assistance Needed: +2 History of Present Illness: pt reports new pain medicine is helping her better    Subjective Data  Patient Stated Goal: to go home    Cognition  Cognition Arousal/Alertness: Awake/alert Behavior During Therapy: WFL for tasks assessed/performed Overall Cognitive Status: Within Functional Limits for tasks assessed    Balance  Balance Overall balance assessment: Needs assistance Sitting-balance support: No upper extremity supported Sitting balance-Leahy Scale: Normal Standing balance support: Bilateral upper extremity supported Standing balance-Leahy Scale: Good Standing balance comment: Using RW and NWB on LLE  End of Session PT - End of Session Equipment Utilized During Treatment: Other (comment) (LLE brace maintained) Activity Tolerance: Patient limited by pain Patient left: in bed;with family/visitor present Nurse Communication: Mobility status   GP    Donato Heinz. [place], [place] 05/15/2013, 3:45 PM

## 2013-05-15 NOTE — Evaluation (Signed)
Occupational Therapy Evaluation Patient Details Name: Meagan Harmon MRN: [id number] DOB: 11/26/1960 Today's Date: 05/15/2013 Time: [id number] OT Time Calculation (min): 20 min  OT Assessment / Plan / Recommendation History of present illness [name] working on a step ladder yesterday when descending off steps missed judged the last one. She had immediate onset of pain and when trying to get and put pressure on her left leg was unable due to pain and instability. No other injuries to report. Pt underwent ORIIF of left tibial plateau fracture 05/14/13   Clinical Impression   Pt presents with below problem list. Pt independent with ADLs, PTA. Feel pt will benefit from acute OT to increase independence prior to d/c.     OT Assessment  Patient needs continued OT Services    Follow Up Recommendations  No OT follow up;Supervision/Assistance - 24 hour    Barriers to Discharge      Equipment Recommendations  None recommended by OT    Recommendations for Other Services    Frequency  Min 2X/week    Precautions / Restrictions Precautions Precautions: Fall Required Braces or Orthoses: Other Brace/Splint Other Brace/Splint: bilateral upright hinged knee brace Restrictions Weight Bearing Restrictions: Yes LLE Weight Bearing: Non weight bearing   Pertinent Vitals/Pain Pain 8/10. Nurse notified.      ADL  Lower Body Dressing: Maximal assistance Where Assessed - Lower Body Dressing: Supported sit to stand Toilet Transfer: Moderate assistance;Minimal assistance Toilet Transfer Method: Sit to stand;Stand pivot Toilet Transfer Equipment: Bedside commode Toileting - Clothing Manipulation and Hygiene: Minimal assistance Where Assessed - Camera operator Manipulation and Hygiene: Standing;Sit on 3-in-1 or toilet Tub/Shower Transfer Method: Not assessed Equipment Used: Gait belt;Reacher;Rolling walker;Sock aid Transfers/Ambulation Related to ADLs: Min A/Mod A ADL Comments:  Educated on dressing technique and AE for LB ADLs. Recommended sitting to get dressed. Pt performed toilet transfer. Discussed use of bag on walker to carry items. Pt performed theraband exercises in bed and OT explained/demonstrated chair push ups and explained that increasing UE strength can help with transfers. Discussed safe shoe wear and for spouse to have rugs picked up (non skid in bathroom).   OT Diagnosis: Acute pain  OT Problem List: Decreased strength;Decreased activity tolerance;Decreased range of motion;Impaired balance (sitting and/or standing);Decreased knowledge of precautions;Decreased knowledge of use of DME or AE;Pain OT Treatment Interventions: Self-care/ADL training;Therapeutic exercise;DME and/or AE instruction;Therapeutic activities;Patient/family education;Balance training   OT Goals(Current goals can be found in the care plan section) Acute Rehab OT Goals Patient Stated Goal: not stated OT Goal Formulation: With patient Time For Goal Achievement: 05/22/13 Potential to Achieve Goals: Good ADL Goals Pt Will Transfer to Toilet: with min guard assist;stand pivot transfer;ambulating;bedside commode Pt Will Perform Toileting - Clothing Manipulation and hygiene: with min guard assist;sit to/from stand Additional ADL Goal #1: Pt will independently perform HEP to increase strength in bilateral UE's to assist with transfers.  Visit Information  Last OT Received On: 05/15/13 Assistance Needed: +2 History of Present Illness: [name] working on a step ladder yesterday when descending off steps missed judged the last one. She had immediate onset of pain and when trying to get and put pressure on her left leg was unable due to pain and instability. No other injuries to report. Pt underwent ORIIF of left tibial plateau fracture 05/14/13       Prior [place] expects to be discharged to:: Private residence Living Arrangements:  Spouse/significant  other Available Help at Discharge: Family Type of Home: House Home Access: Stairs to enter [employer] of Steps: ~4 Home Layout: One level Home Equipment: Environmental consultant - 2 wheels;Bedside commode Prior Function Level of Independence: Independent Communication Communication: No difficulties         Vision/Perception     Cognition  Cognition Arousal/Alertness: Awake/alert Behavior During Therapy: WFL for tasks assessed/performed Overall Cognitive Status: Within Functional Limits for tasks assessed    Extremity/Trunk Assessment Upper Extremity Assessment Upper Extremity Assessment: Generalized weakness Lower Extremity Assessment Lower Extremity Assessment: Defer to PT evaluation     Mobility Bed Mobility Overal bed mobility: Needs Assistance Bed Mobility: Supine to Sit;Sit to Supine Supine to sit: Min guard Sit to supine: Min assist General bed mobility comments: Assisted with LLE minimally. Pt using rails. Transfers Overall transfer level: Needs assistance Equipment used: Rolling walker (2 wheeled) Transfers: Sit to/from [facility] Sit to Stand: Mod assist Stand pivot transfers: Min assist;Mod assist General transfer comment: Cues for technique.     Exercise General Exercises - Upper Extremity Shoulder Flexion: AROM;Both;10 reps;Supine Shoulder ABduction: AROM;Both;10 reps;Supine Elbow Extension: AROM;Both;20 reps;Supine Chair Push Up:  (educated on these)       End of Session OT - End of Session Equipment Utilized During Treatment: Gait belt;Rolling walker Activity Tolerance: Patient tolerated treatment well Patient left: in bed;with call bell/phone within reach;with bed alarm set Nurse Communication: Mobility status;Other (comment) (pain level)  GO     Benito Mccreedy OTR/L [id number] 05/15/2013, 6:09 PM

## 2013-05-15 NOTE — Care Management Note (Signed)
CARE MANAGEMENT NOTE 05/15/2013  Patient:  Meagan Harmon, Meagan Harmon   Account Number:  [id number]  Date Initiated:  05/15/2013  Documentation initiated by:  Ricki Miller  Subjective/Objective Assessment:   53 yr old female s/p left tibial plateau fracture with ORIF.     Action/Plan:   Case Manager spoke with patient and husband concerning home health and DME needs. Choice offered.Called Marie, [facility].   Anticipated DC Date:  05/16/2013   Anticipated DC Plan:  [place] [facility]  CM consult      [place]   Choice offered to / List presented to:  C-1 Patient   DME arranged  [place]  3-N-1      DME agency  [place] arranged  [place].   Status of service:  Completed, signed off Medicare Important Message given?   (If response is "NO", the following Medicare IM given date fields will be blank) Date Medicare IM given:   Date Additional Medicare IM given:    Discharge Disposition:  [place]  Per UR Regulation:

## 2013-05-15 NOTE — Evaluation (Signed)
Physical Therapy Evaluation Patient Details Name: Meagan Harmon MRN: [id number] DOB: 1960-11-03 Today's Date: 05/15/2013 Time: [id number] PT Time Calculation (min): 20 min  PT Assessment / Plan / Recommendation History of Present Illness  [name] working on a step ladder yesterday when descending off steps missed judged the last one.  She had immediate onset of pain and when trying to get and put pressure on her left leg was unable due to pain and instability.  No other injuries to report.  Pt underwent ORIIF of left tibial plateau fracture 05/14/13  Clinical Impression  [name] is limited by pain today, but she was still able to move with assist. Her husband is supportive and helpful    PT Assessment  Patient needs continued PT services    Follow Up Recommendations  Home health PT    Does the patient have the potential to tolerate intense rehabilitation      Barriers to Discharge Inaccessible home environment husband says he was a Runner, broadcasting/film/video in [place] and has a friend who can help lift pt up the steps in a wheelchair    Equipment Recommendations  Wheelchair (measurements PT);Wheelchair cushion (measurements PT) Photographer)    Recommendations for Other Services OT consult   Frequency 7X/week    Precautions / Restrictions Precautions Required Braces or Orthoses: Other Brace/Splint Other Brace/Splint: bilateral upright hinged knee brace Restrictions Weight Bearing Restrictions: Yes LLE Weight Bearing: Non weight bearing   Pertinent Vitals/Pain 9/10 in left leg      Mobility  Bed Mobility Overal bed mobility: Needs Assistance Bed Mobility: Supine to Sit Supine to sit: Mod assist General bed mobility comments: pt is limited by pain Transfers Overall transfer level: Needs assistance Equipment used: 2 person hand held assist Transfers: Sit to/from [facility] Sit to Stand: +2 physical assistance;Mod assist Stand pivot transfers: +2  physical assistance;Mod assist General transfer comment: needs assist to hold LLE up, assist in lifting hips off chair and directing right arm to reach for armrest to prepare to pivot    Exercises     PT Diagnosis: Acute pain;Difficulty walking  PT Problem List: Pain;Decreased activity tolerance;Decreased mobility;Decreased strength PT Treatment Interventions: Functional mobility training;Therapeutic activities;Therapeutic exercise;Patient/family education     PT Goals(Current goals can be found in the care plan section) Acute Rehab PT Goals Patient Stated Goal: to go home  PT Goal Formulation: With patient/family Time For Goal Achievement: 05/22/13 Potential to Achieve Goals: Good  Visit Information  Last PT Received On: 05/15/13 History of Present Illness: [name] working on a step ladder yesterday when descending off steps missed judged the last one.  She had immediate onset of pain and when trying to get and put pressure on her left leg was unable due to pain and instability.  No other injuries to report.  Pt underwent ORIIF of left tibial plateau fracture 05/14/13       Prior [place] expects to be discharged to:: Private residence Living Arrangements: Spouse/significant other Available Help at Discharge: Family Type of Home: House Home Access: Stairs to enter [employer] of Steps: ~4 Home Layout: One level Home Equipment: Environmental consultant - 2 wheels;Bedside commode Prior Function Level of Independence: Independent Communication Communication: No difficulties    Cognition  Cognition Arousal/Alertness: Awake/alert Behavior During Therapy: WFL for tasks assessed/performed Overall Cognitive Status: Within Functional Limits for tasks assessed    Extremity/Trunk Assessment Lower Extremity Assessment Lower Extremity Assessment: LLE  deficits/detail LLE Deficits / Details: bilateral upright hinged braced.  Cervical / Trunk  Assessment Cervical / Trunk Assessment: Normal   Balance Balance Overall balance assessment: Needs assistance Sitting-balance support: No upper extremity supported Sitting balance-Leahy Scale: Normal  End of Session PT - End of Session Activity Tolerance: Patient limited by pain Patient left: in chair;with family/visitor present Nurse Communication: Mobility status  GP    Donato Heinz. [place], [place] 05/15/2013, 1:33 PM

## 2013-05-15 NOTE — Care Management Utilization Note (Signed)
UR review completed. Brenin Heidelberger, RN BSN Case Manager 

## 2013-05-16 LAB — COMPREHENSIVE METABOLIC PANEL
ALK PHOS: 129 U/L — AB (ref 39–117)
ALT: 28 U/L (ref 0–35)
AST: 36 U/L (ref 0–37)
Albumin: 2.5 g/dL — ABNORMAL LOW (ref 3.5–5.2)
BUN: 15 mg/dL (ref 6–23)
CALCIUM: 9 mg/dL (ref 8.4–10.5)
CO2: 27 meq/L (ref 19–32)
Chloride: 97 mEq/L (ref 96–112)
Creatinine, Ser: 0.76 mg/dL (ref 0.50–1.10)
GLUCOSE: 119 mg/dL — AB (ref 70–99)
POTASSIUM: 3.9 meq/L (ref 3.7–5.3)
Sodium: 138 mEq/L (ref 137–147)
Total Bilirubin: 0.7 mg/dL (ref 0.3–1.2)
Total Protein: 6.6 g/dL (ref 6.0–8.3)

## 2013-05-16 LAB — VITAMIN D 1,25 DIHYDROXY
VITAMIN D3 1, 25 (OH): 43 pg/mL
Vitamin D 1, 25 (OH)2 Total: 43 pg/mL (ref 18–72)

## 2013-05-16 LAB — CBC
HCT: 32.1 % — ABNORMAL LOW (ref 36.0–46.0)
Hemoglobin: 11 g/dL — ABNORMAL LOW (ref 12.0–15.0)
MCH: 32.4 pg (ref 26.0–34.0)
MCHC: 34.3 g/dL (ref 30.0–36.0)
MCV: 94.4 fL (ref 78.0–100.0)
Platelets: 194 10*3/uL (ref 150–400)
RBC: 3.4 MIL/uL — ABNORMAL LOW (ref 3.87–5.11)
RDW: 14 % (ref 11.5–15.5)
WBC: 10.1 10*3/uL (ref 4.0–10.5)

## 2013-05-16 MED ORDER — ENOXAPARIN SODIUM 40 MG/0.4ML ~~LOC~~ SOLN: 40.0000 mg | SUBCUTANEOUS | Status: DC

## 2013-05-16 MED ORDER — METHOCARBAMOL 500 MG PO TABS: 500.0000 mg | ORAL_TABLET | Freq: Four times a day (QID) | ORAL | Status: DC | PRN

## 2013-05-16 MED ORDER — OXYCODONE HCL 5 MG PO TABS
5.0000 mg | ORAL_TABLET | ORAL | Status: DC | PRN
  Administered 2013-05-16 – 2013-05-17 (×5): 10 mg via ORAL
  Filled 2013-05-16 (×5): qty 2

## 2013-05-16 MED ORDER — DSS 100 MG PO CAPS
100.0000 mg | ORAL_CAPSULE | Freq: Two times a day (BID) | ORAL | Status: DC
Start: 2013-05-16 — End: 2021-12-27

## 2013-05-16 MED ORDER — OXYCODONE HCL 5 MG PO TABS: 5.0000 mg | ORAL_TABLET | ORAL | Status: DC | PRN

## 2013-05-16 MED ORDER — OXYCODONE-ACETAMINOPHEN 5-325 MG PO TABS: 1.0000 | ORAL_TABLET | Freq: Four times a day (QID) | ORAL | Status: DC | PRN

## 2013-05-16 MED ORDER — HYDROMORPHONE HCL PF 1 MG/ML IJ SOLN: 0.5000 mg | INTRAMUSCULAR | Status: DC | PRN

## 2013-05-16 MED ORDER — ENOXAPARIN (LOVENOX) PATIENT EDUCATION KIT: PACK | Freq: Once | Status: DC

## 2013-05-16 NOTE — Discharge Summary (Signed)
Orthopaedic Trauma Service (OTS)  Patient ID: Meagan Harmon MRN: [id number] DOB/AGE: [birth year] 53 y.o.  Admit date: 05/12/2013 Discharge date: 05/16/2013  Admission Diagnoses: Fall L tibial plateau fracture GERD  Discharge Diagnoses:  Active Problems:   GERD   Tibial plateau fracture, left   Procedures Performed: 05/14/2013- Dr. Marcelino Scot  Open reduction and internal fixation, left lateral tibial  plateau.   Discharged Condition: good  Hospital Course:   Patient is a 53 year old white female who sustained a fall off of a step stool on 05/12/2013. This resulted in a left tibial plateau fracture. Patient was admitted for pain control and management. Orthopedic trauma service evaluated the patient on 05/13/2013. She was taken to the operating room on 05/14/2013 for ORIF of left lateral tibial plateau fracture. Patient tolerated the procedure very well. After surgery she was taken to the PACU for recovery from anesthesia and was taken back to the orthopedic floor for continued observation, pain control and to begin therapies. On postoperative day #1 patient was doing very well. Her pain was tolerable but still requiring IV pain medicine for adequate control. She was fitted for a hinged knee brace on postoperative day #1 as well to allow for full range of motion of her left knee. She continued to progress very well on postoperative day #1. She was also started on Lovenox for DVT and PE prophylaxis. She was also continued on proton ask for her significant GERD as we do not have her home medication here on formulary. Patient continued to progress well and on postoperative day #2 she did very well. Her PCA was discontinued on postoperative day #2 and she was transitioned to [place] and Percocet as well as Robaxin. We did commence a metabolic bone workup which was negative based on our findings. Her 25 hydroxy vitamin D level is at 72 which is extremely good. And I would recommend a DEXA scan as an  outpatient. Patient's blood pressure was little elevated on postoperative day #2 as well restarted her lisinopril. On postoperative day #3 patient was doing fantastic and was requesting to be discharged home. No acute issues were noted. Her dressings were changed on postoperative day #3. Her operative wounds look fantastic. She was instructed on home exercises including active and passive right knee range of motion. Prone knee range of motion exercises as well as quad sets, straight leg raises, long ARC quads, short ARC quads, stretching and other activities. Patient was tolerating regular diet and passing gas. She was also voiding without any difficulty. On postoperative day #3 patient was deemed to be in stable condition for discharge to home.  Consults: None  Significant Diagnostic Studies: labs:   Results for Meagan Harmon, Meagan Harmon (MRN [id number]) as of 05/17/2013 12:14  Ref. Range 05/16/2013 05:08  Sodium Latest Range: 137-147 mEq/L 138  Potassium Latest Range: 3.7-5.3 mEq/L 3.9  Chloride Latest Range: 96-112 mEq/L 97  CO2 Latest Range: 19-32 mEq/L 27  BUN Latest Range: 6-23 mg/dL 15  Creatinine Latest Range: 0.50-1.10 mg/dL 0.76  Calcium Latest Range: 8.4-10.5 mg/dL 9.0  GFR calc non Af Amer Latest Range: >90 mL/min >90  GFR calc Af Amer Latest Range: >90 mL/min >90  Glucose Latest Range: 70-99 mg/dL 119 (H)  Alkaline Phosphatase Latest Range: 39-117 U/L 129 (H)  Albumin Latest Range: 3.5-5.2 g/dL 2.5 (L)  AST Latest Range: 0-37 U/L 36  ALT Latest Range: 0-35 U/L 28  Total Protein Latest Range: 6.0-8.3 g/dL 6.6  Total Bilirubin Latest Range: 0.3-1.2 mg/dL 0.7  WBC Latest Range: 4.0-10.5 K/uL 10.1  RBC Latest Range: 3.87-5.11 MIL/uL 3.40 (L)  Hemoglobin Latest Range: 12.0-15.0 g/dL 11.0 (L)  HCT Latest Range: 36.0-46.0 % 32.1 (L)  MCV Latest Range: 78.0-100.0 fL 94.4  MCH Latest Range: 26.0-34.0 pg 32.4  MCHC Latest Range: 30.0-36.0 g/dL 34.3  RDW Latest Range: 11.5-15.5 % 14.0   Platelets Latest Range: 150-400 K/uL 194   Results for Meagan Harmon, Meagan Harmon (MRN [id number]) as of 05/17/2013 12:14  Ref. Range 05/14/2013 04:29  Vit D, 25-Hydroxy Latest Range: 30-89 ng/mL 76    Treatments: IV hydration, antibiotics: Ancef, analgesia: acetaminophen, Morphine and oxycodone, percocet, anticoagulation: LMW heparin, therapies: PT, OT and RN and surgery: as above   Discharge Exam:    Orthopaedic Trauma Service Progress Note  Subjective  Doing great Ready to go home Pain controlled Working well with therapy    Objective   BP 95/53  Pulse 79  Temp(Src) 98 F (36.7 C) (Oral)  Resp 18  Ht 5\' 3"  (1.6 m)  Wt 72.576 kg (160 lb)  BMI 28.35 kg/m2  SpO2 96%  Intake/Output     02/26 0701 - 02/27 0700 02/27 0701 - 02/28 0700    P.O.  240    I.V. (mL/kg)      Total Intake(mL/kg)  240 (3.3)    Net   +240          Urine Occurrence 5 x       Labs  No new labs  Exam  Gen: awake and alert, NAD, sitting in bed Ext:        Left Lower Extremity               Dressing changed             Incision looks great, no signs of infection, no drainage             Swelling controlled             Ext warm             + DP pulse             Compartments soft and NT             Excellent range of motion: 0-50 w/o pain    Assessment and Plan   POD/HD#: 55   53 y/o female s/p fall off step stool with split depression L tibial plateau fracture  1: Left lateral tibial plateau fracture, split depression, Schatzker 2, OTA 41-B3            NWB x 6 weeks           Unrestricted ROM             PT/OT                       PT- please teach HEP for R knee ROM- AROM, PROM. Prone exercises as well. No ROM restrictions.  Quad sets, SLR, LAQ, SAQ, heel slides, stretching, prone flexion and extension, etc. If possible please write down for pt.             Ice and elevate           Total knee precautions             Dressing changed             2. Pain management:  Oxy IR, percocet, robaxin  3. ABL anemia/Hemodynamics            stable   4. DVT/PE prophylaxis:            SCDs                        lovenox x 3 weeks post op   5. Metabolic Bone Disease:            vitamin D levels very good             No further workup at this time as inpt           Would recommend DEXA as outpatient   6. Activity:            As per #1  7. FEN/Foley/Lines:           diet as tolerated           dc lines   8. medical issues            lisinopril             BP stable    9. Dispo:          plan for dc home     Jari Pigg, PA-C [facility] [phone] (P) 05/17/2013 12:13 PM   Disposition: Home with home health  Discharge Orders   Future Orders Complete By Expires   Call MD / Call 911  As directed    Comments:     If you experience chest pain or shortness of breath, CALL 911 and be transported to the hospital emergency room.  If you develope a fever above 101 F, pus (white drainage) or increased drainage or redness at the wound, or calf pain, call your surgeon's office.   Constipation Prevention  As directed    Comments:     Drink plenty of fluids.  Prune juice may be helpful.  You may use a stool softener, such as Colace (over the counter) 100 mg twice a day.  Use MiraLax (over the counter) for constipation as needed.   Diet general  As directed    Discharge instructions  As directed    Comments:     Orthopaedic Trauma Service Discharge Instructions   General Discharge Instructions  WEIGHT BEARING STATUS: Nonweightbearing Left Leg  RANGE OF MOTION/ACTIVITY: Range of motion Left knee as tolerated.  No pillows under knee when at rest   Wound Care: dry dressing changes every other day.  See detailed instructions below.  Ok to shower and wash with soap and water once wound dry.  Do not apply ointments or lotions to wound   Diet: as you were eating previously.  Can use over the counter stool softeners and bowel  preparations, such as Miralax, to help with bowel movements.  Narcotics can be constipating.  Be sure to drink plenty of fluids  STOP SMOKING OR USING NICOTINE PRODUCTS!!!!  As discussed nicotine severely impairs your body's ability to heal surgical and traumatic wounds but also impairs bone healing.  Wounds and bone heal by forming microscopic blood vessels (angiogenesis) and nicotine is a vasoconstrictor (essentially, shrinks blood vessels).  Therefore, if vasoconstriction occurs to these microscopic blood vessels they essentially disappear and are unable to deliver necessary nutrients to the healing tissue.  This is one modifiable factor that you can do to dramatically increase your chances of healing your injury.    (This means  no smoking, no nicotine gum, patches, etc)  DO NOT USE NONSTEROIDAL ANTI-INFLAMMATORY DRUGS (NSAID'S)  Using products such as Advil (ibuprofen), Aleve (naproxen), Motrin (ibuprofen) for additional pain control during fracture healing can delay and/or prevent the healing response.  If you would like to take over the counter (OTC) medication, Tylenol (acetaminophen) is ok.  However, some narcotic medications that are given for pain control contain acetaminophen as well. Therefore, you should not exceed more than 4000 mg of tylenol in a day if you do not have liver disease.  Also note that there are may OTC medicines, such as cold medicines and allergy medicines that my contain tylenol as well.  If you have any questions about medications and/or interactions please ask your doctor/PA or your pharmacist.   PAIN MEDICATION USE AND EXPECTATIONS  You have likely been given narcotic medications to help control your pain.  After a traumatic event that results in an fracture (broken bone) with or without surgery, it is ok to use narcotic pain medications to help control one's pain.  We understand that everyone responds to pain differently and each individual patient will be evaluated on a  regular basis for the continued need for narcotic medications. Ideally, narcotic medication use should last no more than 6-8 weeks (coinciding with fracture healing).   As a patient it is your responsibility as well to monitor narcotic medication use and report the amount and frequency you use these medications when you come to your office visit.   We would also advise that if you are using narcotic medications, you should take a dose prior to therapy to maximize you participation.  IF YOU ARE ON NARCOTIC MEDICATIONS IT IS NOT PERMISSIBLE TO OPERATE A MOTOR VEHICLE (MOTORCYCLE/CAR/TRUCK/MOPED) OR HEAVY MACHINERY DO NOT MIX NARCOTICS WITH OTHER CNS (CENTRAL NERVOUS SYSTEM) DEPRESSANTS SUCH AS ALCOHOL       ICE AND ELEVATE INJURED/OPERATIVE EXTREMITY  Using ice and elevating the injured extremity above your heart can help with swelling and pain control.  Icing in a pulsatile fashion, such as 20 minutes on and 20 minutes off, can be followed.    Do not place ice directly on skin. Make sure there is a barrier between to skin and the ice pack.    Using frozen items such as frozen peas works well as the conform nicely to the are that needs to be iced.  USE AN ACE WRAP OR TED HOSE FOR SWELLING CONTROL  In addition to icing and elevation, Ace wraps or TED hose are used to help limit and resolve swelling.  It is recommended to use Ace wraps or TED hose until you are informed to stop.    When using Ace Wraps start the wrapping distally (farthest away from the body) and wrap proximally (closer to the body)   Example: If you had surgery on your leg or thing and you do not have a splint on, start the ace wrap at the toes and work your way up to the thigh        If you had surgery on your upper extremity and do not have a splint on, start the ace wrap at your fingers and work your way up to the upper arm  IF YOU ARE IN A SPLINT OR CAST DO NOT [place]   If your splint gets wet for any reason  please contact the office immediately. You may shower in your splint or cast as long as you keep it dry.  This can be done by wrapping in a cast cover or garbage back (or similar)  Do Not stick any thing down your splint or cast such as pencils, money, or hangers to try and scratch yourself with.  If you feel itchy take benadryl as prescribed on the bottle for itching  IF YOU ARE IN A CAM BOOT (BLACK BOOT)  You may remove boot periodically. Perform daily dressing changes as noted below.  Wash the liner of the boot regularly and wear a sock when wearing the boot. It is recommended that you sleep in the boot until told otherwise  CALL THE OFFICE WITH ANY QUESTIONS OR CONCERTS: [phone]     Discharge Pin Site Instructions  Dress pins daily with Kerlix roll starting on POD 2. Wrap the Kerlix so that it tamps the skin down around the pin-skin interface to prevent/limit motion of the skin relative to the pin.  (Pin-skin motion is the primary cause of pain and infection related to external fixator pin sites).  Remove any crust or coagulum that may obstruct drainage with a saline moistened gauze or soap and water.  After POD 3, if there is no discernable drainage on the pin site dressing, the interval for change can by increased to every other day.  You may shower with the fixator, cleaning all pin sites gently with soap and water.  If you have a surgical wound this needs to be completely dry and without drainage before showering.  The extremity can be lifted by the fixator to facilitate wound care and transfers.  Notify the office/Doctor if you experience increasing drainage, redness, or pain from a pin site, or if you notice purulent (thick, snot-like) drainage.  Discharge Wound Care Instructions  Do NOT apply any ointments, solutions or lotions to pin sites or surgical wounds.  These prevent needed drainage and even though solutions like hydrogen peroxide kill bacteria, they also damage  cells lining the pin sites that help fight infection.  Applying lotions or ointments can keep the wounds moist and can cause them to breakdown and open up as well. This can increase the risk for infection. When in doubt call the office.  Surgical incisions should be dressed daily.  If any drainage is noted, use one layer of adaptic, then gauze, Kerlix, and an ace wrap.  Once the incision is completely dry and without drainage, it may be left open to air out.  Showering may begin 36-48 hours later.  Cleaning gently with soap and water.  Traumatic wounds should be dressed daily as well.    One layer of adaptic, gauze, Kerlix, then ace wrap.  The adaptic can be discontinued once the draining has ceased    If you have a wet to dry dressing: wet the gauze with saline the squeeze as much saline out so the gauze is moist (not soaking wet), place moistened gauze over wound, then place a dry gauze over the moist one, followed by Kerlix wrap, then ace wrap.   Do not put a pillow under the knee. Place it under the heel.  As directed    Driving restrictions  As directed    Comments:     No driving   Increase activity slowly as tolerated  As directed    Non weight bearing  As directed    Questions:     Laterality:     Extremity:         Medication List         ASTEPRO  0.15 % Soln  Generic drug:  Azelastine HCl  Place 2 sprays into both nostrils daily.     BIOTIN PO  Take 1 tablet by mouth daily.     buPROPion 100 MG tablet  Commonly known as:  WELLBUTRIN  Take 100 mg by mouth 2 (two) times daily.     cyanocobalamin 1000 MCG/ML injection  Commonly known as:  (VITAMIN B-12)  Inject 1,000 mcg into the muscle daily.     DSS 100 MG Caps  Take 100 mg by mouth 2 (two) times daily.     enoxaparin 40 MG/0.4ML injection  Commonly known as:  LOVENOX  Inject 0.4 mLs (40 mg total) into the skin daily.     levocetirizine 5 MG tablet  Commonly known as:  XYZAL  Take 5 mg by mouth at bedtime.      lisinopril 10 MG tablet  Commonly known as:  PRINIVIL,ZESTRIL  Take 10 mg by mouth daily.     methocarbamol 500 MG tablet  Commonly known as:  ROBAXIN  Take 1-2 tablets (500-1,000 mg total) by mouth every 6 (six) hours as needed for muscle spasms.     NARCOSOFT HERBAL LAX Caps  Take 2-3 capsules by mouth 2 (two) times daily. Takes 2 capsules in the morning and 3 capsules at night     oxyCODONE 5 MG immediate release tablet  Commonly known as:  Oxy IR/ROXICODONE  Take 1-3 tablets (5-15 mg total) by mouth every 3 (three) hours as needed for breakthrough pain.     oxyCODONE-acetaminophen 5-325 MG per tablet  Commonly known as:  PERCOCET/ROXICET  Take 1-2 tablets by mouth every 6 (six) hours as needed for severe pain.     RABEprazole 20 MG tablet  Commonly known as:  ACIPHEX  Take 20 mg by mouth every morning.     sertraline 100 MG tablet  Commonly known as:  ZOLOFT  Take 100 mg by mouth 2 (two) times daily.     VITAMIN K PO  Take 1 tablet by mouth daily.           Follow-up Information   Follow up with HANDY,MICHAEL H, MD. Schedule an appointment as soon as possible for a visit in 2 weeks.   Specialty:  Orthopedic Surgery   Contact information:   [address] [place] [address] [phone]       Discharge Instructions and Plan: Mrs. Kozloff has sustained a fairly severe injury to the Left knee. We were able to achieve excellent fixation with plate osteosynthesis. It was a technically successful procedure. We were able to restore alignment, assess for meniscal injury, address stability and restored joint surface congruity which are all favorable to a successful outcome. Patient is still at risk for the development of posttraumatic arthritis which has been discussed at length and we will continue to monitor the patient for signs and symptoms of such. Patient will be nonweightbearing for the next 6-8 weeks. Unrestricted range of motion of the Left  knee in the hinged knee brace. total knee precautions should be followed when at rest We will refer her to outpatient physical therapy after the first postoperative visit. she will be on lovenox for DVT/PE prophylaxis for 3 weeks Daily dressing changes should be performed as per discharge wound care instructions. Patient can resume prehospital diet Patient will be on oxycodone, Percocet, Robaxin for pain control We will see the patient back in the office in 10-14 days for reevaluation, followup x-rays and removal  of sutures. Should the patient have any questions for the first postoperative visit and encouraged to contact the office immediately. Patient will be vigilant for any redness increased drainage and increased pain, which are suggestive of infection and will contact the office immediately. Patient will also be vigilant for fevers or chills or any other concerning signs. They will contact the office if any of these develop.   Signed:  Jari Pigg, PA-C [facility] [phone] (P) 05/16/2013, 9:50 AM

## 2013-05-16 NOTE — Discharge Instructions (Signed)
[facility] Discharge Instructions   General Discharge Instructions  WEIGHT BEARING STATUS: Nonweightbearing Left Leg  RANGE OF MOTION/ACTIVITY: Range of motion Left knee as tolerated.  No pillows under knee when at rest   Wound Care: dry dressing changes every other day.  See detailed instructions below.  Ok to shower and wash with soap and water once wound dry.  Do not apply ointments or lotions to wound   Diet: as you were eating previously.  Can use over the counter stool softeners and bowel preparations, such as Miralax, to help with bowel movements.  Narcotics can be constipating.  Be sure to drink plenty of fluids  STOP SMOKING OR USING NICOTINE PRODUCTS!!!!  As discussed nicotine severely impairs your body's ability to heal surgical and traumatic wounds but also impairs bone healing.  Wounds and bone heal by forming microscopic blood vessels (angiogenesis) and nicotine is a vasoconstrictor (essentially, shrinks blood vessels).  Therefore, if vasoconstriction occurs to these microscopic blood vessels they essentially disappear and are unable to deliver necessary nutrients to the healing tissue.  This is one modifiable factor that you can do to dramatically increase your chances of healing your injury.    (This means no smoking, no nicotine gum, patches, etc)  DO NOT USE NONSTEROIDAL ANTI-INFLAMMATORY DRUGS (NSAID'S)  Using products such as Advil (ibuprofen), Aleve (naproxen), Motrin (ibuprofen) for additional pain control during fracture healing can delay and/or prevent the healing response.  If you would like to take over the counter (OTC) medication, Tylenol (acetaminophen) is ok.  However, some narcotic medications that are given for pain control contain acetaminophen as well. Therefore, you should not exceed more than 4000 mg of tylenol in a day if you do not have liver disease.  Also note that there are may OTC medicines, such as cold medicines and allergy medicines that  my contain tylenol as well.  If you have any questions about medications and/or interactions please ask your doctor/PA or your pharmacist.   PAIN MEDICATION USE AND EXPECTATIONS  You have likely been given narcotic medications to help control your pain.  After a traumatic event that results in an fracture (broken bone) with or without surgery, it is ok to use narcotic pain medications to help control one's pain.  We understand that everyone responds to pain differently and each individual patient will be evaluated on a regular basis for the continued need for narcotic medications. Ideally, narcotic medication use should last no more than 6-8 weeks (coinciding with fracture healing).   As a patient it is your responsibility as well to monitor narcotic medication use and report the amount and frequency you use these medications when you come to your office visit.   We would also advise that if you are using narcotic medications, you should take a dose prior to therapy to maximize you participation.  IF YOU ARE ON NARCOTIC MEDICATIONS IT IS NOT PERMISSIBLE TO OPERATE A MOTOR VEHICLE (MOTORCYCLE/CAR/TRUCK/MOPED) OR HEAVY MACHINERY DO NOT MIX NARCOTICS WITH OTHER CNS (CENTRAL NERVOUS SYSTEM) DEPRESSANTS SUCH AS ALCOHOL       ICE AND ELEVATE INJURED/OPERATIVE EXTREMITY  Using ice and elevating the injured extremity above your heart can help with swelling and pain control.  Icing in a pulsatile fashion, such as 20 minutes on and 20 minutes off, can be followed.    Do not place ice directly on skin. Make sure there is a barrier between to skin and the ice pack.    Using frozen items such  as frozen peas works well as the conform nicely to the are that needs to be iced.  USE AN ACE WRAP OR TED HOSE FOR SWELLING CONTROL  In addition to icing and elevation, Ace wraps or TED hose are used to help limit and resolve swelling.  It is recommended to use Ace wraps or TED hose until you are informed to stop.     When using Ace Wraps start the wrapping distally (farthest away from the body) and wrap proximally (closer to the body)   Example: If you had surgery on your leg or thing and you do not have a splint on, start the ace wrap at the toes and work your way up to the thigh        If you had surgery on your upper extremity and do not have a splint on, start the ace wrap at your fingers and work your way up to the upper arm  IF YOU ARE IN A SPLINT OR CAST DO NOT [place]   If your splint gets wet for any reason please contact the office immediately. You may shower in your splint or cast as long as you keep it dry.  This can be done by wrapping in a cast cover or garbage back (or similar)  Do Not stick any thing down your splint or cast such as pencils, money, or hangers to try and scratch yourself with.  If you feel itchy take benadryl as prescribed on the bottle for itching  IF YOU ARE IN A CAM BOOT (BLACK BOOT)  You may remove boot periodically. Perform daily dressing changes as noted below.  Wash the liner of the boot regularly and wear a sock when wearing the boot. It is recommended that you sleep in the boot until told otherwise  CALL THE OFFICE WITH ANY QUESTIONS OR CONCERTS: [phone]     Discharge Pin Site Instructions  Dress pins daily with Kerlix roll starting on POD 2. Wrap the Kerlix so that it tamps the skin down around the pin-skin interface to prevent/limit motion of the skin relative to the pin.  (Pin-skin motion is the primary cause of pain and infection related to external fixator pin sites).  Remove any crust or coagulum that may obstruct drainage with a saline moistened gauze or soap and water.  After POD 3, if there is no discernable drainage on the pin site dressing, the interval for change can by increased to every other day.  You may shower with the fixator, cleaning all pin sites gently with soap and water.  If you have a surgical wound this needs to be  completely dry and without drainage before showering.  The extremity can be lifted by the fixator to facilitate wound care and transfers.  Notify the office/Doctor if you experience increasing drainage, redness, or pain from a pin site, or if you notice purulent (thick, snot-like) drainage.  Discharge Wound Care Instructions  Do NOT apply any ointments, solutions or lotions to pin sites or surgical wounds.  These prevent needed drainage and even though solutions like hydrogen peroxide kill bacteria, they also damage cells lining the pin sites that help fight infection.  Applying lotions or ointments can keep the wounds moist and can cause them to breakdown and open up as well. This can increase the risk for infection. When in doubt call the office.  Surgical incisions should be dressed daily.  If any drainage is noted, use one layer of adaptic, then  gauze, Kerlix, and an ace wrap.  Once the incision is completely dry and without drainage, it may be left open to air out.  Showering may begin 36-48 hours later.  Cleaning gently with soap and water.  Traumatic wounds should be dressed daily as well.    One layer of adaptic, gauze, Kerlix, then ace wrap.  The adaptic can be discontinued once the draining has ceased    If you have a wet to dry dressing: wet the gauze with saline the squeeze as much saline out so the gauze is moist (not soaking wet), place moistened gauze over wound, then place a dry gauze over the moist one, followed by Kerlix wrap, then ace wrap.

## 2013-05-16 NOTE — Progress Notes (Signed)
Physical Therapy Treatment Patient Details Name: Meagan Harmon MRN: [id number] DOB: [birth year] Today's Date: 05/16/2013 Time: [id number] PT Time Calculation (min): 21 min  PT Assessment / Plan / Recommendation  History of Present Illness [name] working on a step ladder yesterday when descending off steps missed judged the last one. She had immediate onset of pain and when trying to get and put pressure on her left leg was unable due to pain and instability. No other injuries to report. Pt underwent ORIIF of left tibial plateau fracture 05/14/13   PT Comments   Pt progressing towards physical therapy goals. Was able to improve gait distance in hallway with chair follow - took 3 seated rest breaks throughout gait training. HEP issued and discussed with pt. Will continue to follow.   Follow Up Recommendations  Home health PT     Does the patient have the potential to tolerate intense rehabilitation     Barriers to Discharge        Equipment Recommendations  Wheelchair (measurements PT);Wheelchair cushion (measurements PT)    Recommendations for Other Services    Frequency Min 5X/week   Progress towards PT Goals Progress towards PT goals: Progressing toward goals  Plan Frequency needs to be updated    Precautions / Restrictions Precautions Precautions: Fall Required Braces or Orthoses: Other Brace/Splint Other Brace/Splint: bledsoe brace Restrictions Weight Bearing Restrictions: Yes LLE Weight Bearing: Non weight bearing   Pertinent Vitals/Pain Pt reports minimal pain throughout session.     Mobility  Bed Mobility Overal bed mobility: Needs Assistance Bed Mobility: Supine to Sit;Sit to Supine Supine to sit: Min assist Sit to supine: Min assist General bed mobility comments: Assist only occasionally for movement of LLE. Pt did not use trapeze or bedrails for support.  Transfers Overall transfer level: Needs assistance Equipment used: Rolling walker (2  wheeled) Transfers: Sit to/from [facility] Sit to Stand: Min guard Stand pivot transfers: Min guard General transfer comment: VC's for hand placement on seated surface for safety.  Ambulation/Gait Ambulation/Gait assistance: Min assist Ambulation Distance (Feet): 45 Feet Assistive device: Rolling walker (2 wheeled) Gait Pattern/deviations:  (Hop-to) Gait velocity: Decreased Gait velocity interpretation: Below normal speed for age/gender General Gait Details: No apparent issues maintaining NWB status on LLE. Pt showing much improvement from last session. Min assist to recover after one LOB laterally to the right while static standing.     Exercises     PT Diagnosis:    PT Problem List:   PT Treatment Interventions:     PT Goals (current goals can now be found in the care plan section) Acute Rehab PT Goals Patient Stated Goal: not stated PT Goal Formulation: With patient/family Time For Goal Achievement: 05/22/13 Potential to Achieve Goals: Good  Visit Information  Last PT Received On: 05/16/13 Assistance Needed: +1 History of Present Illness: [name] working on a step ladder yesterday when descending off steps missed judged the last one. She had immediate onset of pain and when trying to get and put pressure on her left leg was unable due to pain and instability. No other injuries to report. Pt underwent ORIIF of left tibial plateau fracture 05/14/13    Subjective Data  Subjective: "I've been up and moving around." Patient Stated Goal: not stated   Cognition  Cognition Arousal/Alertness: Awake/alert Behavior During Therapy: WFL for tasks assessed/performed Overall Cognitive Status: Within Functional Limits for tasks assessed    Balance  Balance Overall  balance assessment: Needs assistance Sitting-balance support: Feet supported;Bilateral upper extremity supported Sitting balance-Leahy Scale: Good Standing balance support: Bilateral upper  extremity supported Standing balance-Leahy Scale: Fair General Comments General comments (skin integrity, edema, etc.): Provided HEP handout and went over exercises - practiced a few of each.   End of Session PT - End of Session Equipment Utilized During Treatment: Gait belt (LLE brace maintained. ) Activity Tolerance: Patient tolerated treatment well Patient left: in bed;with call bell/phone within reach;with family/visitor present Nurse Communication: Mobility status   GP     Jolyn Lent 05/16/2013, 4:00 PM  Jolyn Lent, [place], DPT [phone]

## 2013-05-16 NOTE — Progress Notes (Signed)
[facility]opaedic Trauma Service Progress Note  Subjective  Doing ok this am Pain controlled Voiding well GERD bothersome, her med not available at hospital and Protonix is minimally effective  + flatus No BM  Review of Systems  Constitutional: Negative for fever and chills.  Respiratory: Negative for shortness of breath and wheezing.   Cardiovascular: Negative for chest pain and palpitations.  Gastrointestinal: Negative for nausea, vomiting and abdominal pain.  Genitourinary: Negative for dysuria.  Neurological: Negative for tingling, sensory change and headaches.     Objective   BP 100/43  Pulse 56  Temp(Src) 98.1 F (36.7 C) (Oral)  Resp 16  Ht 5\' 3"  (1.6 m)  Wt 72.576 kg (160 lb)  BMI 28.35 kg/m2  SpO2 98%  Intake/Output     02/25 0701 - 02/26 0700 02/26 0701 - 02/27 0700   P.O. 1360    I.V. (mL/kg) 300 (4.1)    Total Intake(mL/kg) 1660 (22.9)    Urine (mL/kg/hr)     Blood     Total Output       Net +1660          Urine Occurrence 3 x      Labs Results for DESHANTE, AHUMADA (MRN [id number]) as of 05/16/2013 09:36  Ref. Range 05/16/2013 05:08  Sodium Latest Range: 137-147 mEq/L 138  Potassium Latest Range: 3.7-5.3 mEq/L 3.9  Chloride Latest Range: 96-112 mEq/L 97  CO2 Latest Range: 19-32 mEq/L 27  BUN Latest Range: 6-23 mg/dL 15  Creatinine Latest Range: 0.50-1.10 mg/dL 0.76  Calcium Latest Range: 8.4-10.5 mg/dL 9.0  GFR calc non Af Amer Latest Range: >90 mL/min >90  GFR calc Af Amer Latest Range: >90 mL/min >90  Glucose Latest Range: 70-99 mg/dL 119 (H)  Alkaline Phosphatase Latest Range: 39-117 U/L 129 (H)  Albumin Latest Range: 3.5-5.2 g/dL 2.5 (L)  AST Latest Range: 0-37 U/L 36  ALT Latest Range: 0-35 U/L 28  Total Protein Latest Range: 6.0-8.3 g/dL 6.6  Total Bilirubin Latest Range: 0.3-1.2 mg/dL 0.7  WBC Latest Range: 4.0-10.5 K/uL 10.1  RBC Latest Range: 3.87-5.11 MIL/uL 3.40 (L)  Hemoglobin Latest Range: 12.0-15.0 g/dL 11.0 (L)  HCT Latest  Range: 36.0-46.0 % 32.1 (L)  MCV Latest Range: 78.0-100.0 fL 94.4  MCH Latest Range: 26.0-34.0 pg 32.4  MCHC Latest Range: 30.0-36.0 g/dL 34.3  RDW Latest Range: 11.5-15.5 % 14.0  Platelets Latest Range: 150-400 K/uL 194    Exam  Gen: awake and alert, sitting on EOB, about to work with therapy Lungs: clear B  Cardiac: s1 and s2, RRR Abd: + BS, NTND Ext:       Left Lower Extremity   Hinged knee brace fitting well  Unlocked  Distal motor and sensory functions intact  Ext warm  + DP pulse  No DCT  Compartments soft and NT  No pain with passive stretch  Dressing c/d/i     Assessment and Plan   POD/HD#: 2  53 y/o female s/p fall off step stool with split depression L tibial plateau fracture  1: Left lateral tibial plateau fracture, split depression, Schatzker 2, OTA 41-B3            NWB x 6 weeks  Unrestricted ROM   PT/OT   PT- please teach HEP for R knee ROM- AROM, PROM. Prone exercises as well. No ROM restrictions.  Quad sets, SLR, LAQ, SAQ, heel slides, stretching, prone flexion and extension, etc. If possible please write down for pt.   Ice and elevate  Total  knee precautions   Dressing change tomorrow     2. Pain management:          dc pca  Oxy IR, percocet, robaxin  3. ABL anemia/Hemodynamics            stable   4. DVT/PE prophylaxis:            SCDs    lovenox x 3 weeks post op   5. Metabolic Bone Disease:            vitamin D levels very good   No further workup at this time as inpt  Would recommend DEXA as outpatient   6. Activity:            As per #1  7. FEN/Foley/Lines:            Regular diet for now            NSL IV     8. medical issues            lisinopril restarted  BP stable  Pt instructed to bring in GERD med from home to use   9. Dispo:          plan for dc home tomorrow     Jari Pigg, PA-C [facility] [phone] (P) 05/16/2013 9:33 AM

## 2013-05-16 NOTE — Progress Notes (Signed)
Occupational Therapy Treatment Patient Details Name: Meagan Harmon MRN: [id number] DOB: 1961/03/21 Today's Date: 05/16/2013 Time: [id number] OT Time Calculation (min): 22 min  OT Assessment / Plan / Recommendation  History of present illness [name] working on a step ladder yesterday when descending off steps missed judged the last one. She had immediate onset of pain and when trying to get and put pressure on her left leg was unable due to pain and instability. No other injuries to report. Pt underwent ORIIF of left tibial plateau fracture 05/14/13   OT comments  This pt making progress and pt's husband feels fine with getting pt up to [facility] and pt is maintaining NWB'ing status.  Follow Up Recommendations  No OT follow up;Supervision/Assistance - 24 hour       Equipment Recommendations  None recommended by OT       Frequency Min 2X/week   Progress towards OT Goals Progress towards OT goals: Progressing toward goals  Plan Discharge plan remains appropriate    Precautions / Restrictions Precautions Precautions: Fall Required Braces or Orthoses: Other Brace/Splint Other Brace/Splint: bledsoe brace Restrictions Weight Bearing Restrictions: Yes LLE Weight Bearing: Non weight bearing   Pertinent Vitals/Pain 5/10 LLE; repositioned    ADL  Toilet Transfer: Minimal assistance Toilet Transfer Method: Sit to stand Toilet Transfer Equipment: Bedside commode Toileting - Clothing Manipulation and Hygiene: Minimal assistance Where Assessed - Camera operator Manipulation and Hygiene: Sit to stand from 3-in-1 or toilet Transfers/Ambulation Related to ADLs: Pt up from EOB with Min guard A with RW and then hopped around the bed to recliner with min A (a tendency to posterior lean by hopping too close to front of RW).     OT Goals(current goals can now be found in the care plan section)    Visit Information  Last OT Received On: 05/16/13 Assistance Needed: +1 History of  Present Illness: [name] working on a step ladder yesterday when descending off steps missed judged the last one. She had immediate onset of pain and when trying to get and put pressure on her left leg was unable due to pain and instability. No other injuries to report. Pt underwent ORIIF of left tibial plateau fracture 05/14/13          Cognition  Cognition Arousal/Alertness: Awake/alert Behavior During Therapy: WFL for tasks assessed/performed Overall Cognitive Status: Within Functional Limits for tasks assessed    Mobility  Bed Mobility Overal bed mobility: Needs Assistance Bed Mobility: Supine to Sit Supine to sit: Min assist (for LLE only) Transfers Overall transfer level: Needs assistance Equipment used: Rolling walker (2 wheeled) Transfers: Sit to/from Stand Sit to Stand: Min guard General transfer comment: no issues with maintaining NWB'ing status    Exercises  Other Exercises Other Exercises: I asked pt to show me theraband exercises that she had been shown yesterday, she was able to show me 4; but could not remember the others. Will provide handout next session.      End of Session OT - End of Session Equipment Utilized During Treatment: Gait belt;Rolling walker Activity Tolerance: Patient limited by fatigue Patient left: in chair;with family/visitor present       Almon Register [id number] 05/16/2013, 10:39 AM

## 2013-05-16 NOTE — Care Management Note (Signed)
CARE MANAGEMENT NOTE 05/16/2013  Patient:  CHANTILLE, ARAUZA   Account Number:  [id number]  Date Initiated:  05/15/2013  Documentation initiated by:  Ricki Miller  Subjective/Objective Assessment:   53 yr old female s/p left tibial plateau fracture with ORIF.     Action/Plan:   Case Manager spoke with patient and husband concerning home health and DME needs. Choice offered.Called Marie, [facility] [place].   Anticipated DC Date:  05/17/2013   Anticipated DC Plan:  [place] [facility]  CM consult      [place]   Choice offered to / List presented to:  C-1 Patient   DME arranged  [place]  3-N-1      DME agency  [place] arranged  [place].   Status of service:  Completed, signed off Medicare Important Message given?   (If response is "NO", the following Medicare IM given date fields will be blank) Date Medicare IM given:   Date Additional Medicare IM given:    Discharge Disposition:  [place]  Per UR Regulation:    If discussed at Long Length of Stay Meetings, dates discussed:    Comments:  05/16/13  10:00am Ricki Miller, RN BSN Case Manager Case Manager informed social worker that patient will require ambulance transport home.

## 2013-05-17 NOTE — Progress Notes (Signed)
Orthopaedic Trauma Service Progress Note  Subjective  Doing great Ready to go home Pain controlled Working well with therapy    Objective   BP 95/53  Pulse 79  Temp(Src) 98 F (36.7 C) (Oral)  Resp 18  Ht 5\' 3"  (1.6 m)  Wt 72.576 kg (160 lb)  BMI 28.35 kg/m2  SpO2 96%  Intake/Output     02/26 0701 - 02/27 0700 02/27 0701 - 02/28 0700   P.O.  240   I.V. (mL/kg)     Total Intake(mL/kg)  240 (3.3)   Net   +240        Urine Occurrence 5 x      Labs  No new labs  Exam  Gen: awake and alert, NAD, sitting in bed Ext:       Left Lower Extremity   Dressing changed  Incision looks great, no signs of infection, no drainage  Swelling controlled  Ext warm  + DP pulse  Compartments soft and NT  Excellent range of motion: 0-50 w/o pain    Assessment and Plan   POD/HD#: 67   53 y/o female s/p fall off step stool with split depression L tibial plateau fracture  1: Left lateral tibial plateau fracture, split depression, Schatzker 2, OTA 41-B3            NWB x 6 weeks           Unrestricted ROM             PT/OT                       PT- please teach HEP for R knee ROM- AROM, PROM. Prone exercises as well. No ROM restrictions.  Quad sets, SLR, LAQ, SAQ, heel slides, stretching, prone flexion and extension, etc. If possible please write down for pt.             Ice and elevate           Total knee precautions             Dressing changed             2. Pain management:                     Oxy IR, percocet, robaxin  3. ABL anemia/Hemodynamics            stable   4. DVT/PE prophylaxis:            SCDs                        lovenox x 3 weeks post op   5. Metabolic Bone Disease:            vitamin D levels very good             No further workup at this time as inpt           Would recommend DEXA as outpatient   6. Activity:            As per #1  7. FEN/Foley/Lines:           diet as tolerated           dc lines   8. medical issues  lisinopril            BP stable    9. Dispo:          plan  for dc home     Jari Pigg, PA-C [facility] [phone] (P) 05/17/2013 12:13 PM

## 2013-05-17 NOTE — Progress Notes (Signed)
Physical Therapy Treatment Patient Details Name: Meagan Harmon MRN: [id number] DOB: Jun 14, 1960 Today's Date: 05/17/2013 Time: [id number] PT Time Calculation (min): 18 min  PT Assessment / Plan / Recommendation  History of Present Illness [name] working on a step ladder yesterday when descending off steps missed judged the last one. She had immediate onset of pain and when trying to get and put pressure on her left leg was unable due to pain and instability. No other injuries to report. Pt underwent ORIIF of left tibial plateau fracture 05/14/13   PT Comments   Pt fitted for w/c during session. Pt states it feels comfortable and is not rubbing her at all on the sides, even though it appears a snug fit. Pt and husband anticipate d/c home this afternoon via non-emergent ambulance transport.   Follow Up Recommendations  Home health PT     Does the patient have the potential to tolerate intense rehabilitation     Barriers to Discharge        Equipment Recommendations  Wheelchair (measurements PT);Wheelchair cushion (measurements PT)    Recommendations for Other Services    Frequency Min 5X/week   Progress towards PT Goals Progress towards PT goals: Progressing toward goals  Plan Current plan remains appropriate    Precautions / Restrictions Precautions Precautions: Fall Required Braces or Orthoses: Other Brace/Splint Other Brace/Splint: bledsoe brace Restrictions Weight Bearing Restrictions: Yes LLE Weight Bearing: Non weight bearing   Pertinent Vitals/Pain Pt reports increased pain and fatigue after ambulation. RN provided pain medication at end of session.     Mobility  Bed Mobility Overal bed mobility: Needs Assistance Bed Mobility: Supine to Sit;Sit to Supine Supine to sit: Min assist Sit to supine: Min assist General bed mobility comments: Assist only occasionally for movement of LLE. Pt did not use trapeze or bedrails for support.  Transfers Overall  transfer level: Needs assistance Equipment used: Rolling walker (2 wheeled) Transfers: Sit to/from [facility] Sit to Stand: Min guard Stand pivot transfers: Min guard General transfer comment: VC's for hand placement on seated surface for safety.  Ambulation/Gait Ambulation/Gait assistance: Min assist Ambulation Distance (Feet): 50 Feet Assistive device: Rolling walker (2 wheeled) Gait Pattern/deviations:  (Hop-to) Gait velocity: Decreased Gait velocity interpretation: Below normal speed for age/gender General Gait Details: Pt able to ambulate farther with no seated rest breaks. Min assist to recover from 1 LOB during standing with RW.     Exercises     PT Diagnosis:    PT Problem List:   PT Treatment Interventions:     PT Goals (current goals can now be found in the care plan section) Acute Rehab PT Goals Patient Stated Goal: not stated PT Goal Formulation: With patient/family Time For Goal Achievement: 05/22/13 Potential to Achieve Goals: Good  Visit Information  Last PT Received On: 05/17/13 Assistance Needed: +1 History of Present Illness: [name] working on a step ladder yesterday when descending off steps missed judged the last one. She had immediate onset of pain and when trying to get and put pressure on her left leg was unable due to pain and instability. No other injuries to report. Pt underwent ORIIF of left tibial plateau fracture 05/14/13    Subjective Data  Subjective: "I'm worn out from my shower today." Patient Stated Goal: not stated   Cognition  Cognition Arousal/Alertness: Awake/alert Behavior During Therapy: WFL for tasks assessed/performed Overall Cognitive Status: Within Functional Limits for tasks assessed  Balance  Balance Overall balance assessment: Needs assistance Sitting-balance support: Feet supported;Bilateral upper extremity supported Sitting balance-Leahy Scale: Good Standing balance support: Bilateral  upper extremity supported Standing balance-Leahy Scale: Fair  End of Session PT - End of Session Equipment Utilized During Treatment: Gait belt;Other (comment) (LLE brace maintained) Activity Tolerance: Patient tolerated treatment well Patient left: in bed;with call bell/phone within reach;with family/visitor present Nurse Communication: Mobility status   GP     Jolyn Lent 05/17/2013, 4:10 PM  Jolyn Lent, Kathryn, DPT [phone]

## 2013-05-17 NOTE — Progress Notes (Signed)
Discharge instructions reviewed with patient, IV removed, prescriptions given.

## 2013-05-17 NOTE — Progress Notes (Signed)
Occupational Therapy Treatment Patient Details Name: Meagan Harmon MRN: [id number] DOB: 1960/05/07 Today's Date: 05/17/2013 Time: [id number] OT Time Calculation (min): 10 min  OT Assessment / Plan / Recommendation  History of present illness [name] working on a step ladder yesterday when descending off steps missed judged the last one. She had immediate onset of pain and when trying to get and put pressure on her left leg was unable due to pain and instability. No other injuries to report. Pt underwent ORIIF of left tibial plateau fracture 05/14/13   OT comments  Provided theraband hand out for HEP. Pt. Able to return demo of all indicated exercises.  Answered all questions regarding HEP.  Pt. Eager for d/c home.  Has questions regarding brace wearing schedule and shower indications. Offered to find answers and she States she will ask m.d. Prior to d/c home.    Follow Up Recommendations  No OT follow up           Equipment Recommendations  None recommended by OT        Frequency Min 2X/week   Progress towards OT Goals Progress towards OT goals: Progressing toward goals  Plan Discharge plan remains appropriate    Precautions / Restrictions Precautions Precautions: Fall Required Braces or Orthoses: Other Brace/Splint Other Brace/Splint: bledsoe brace Restrictions Weight Bearing Restrictions: Yes LLE Weight Bearing: Non weight bearing   Pertinent Vitals/Pain 0/10 per pt.    ADL         OT Goals(current goals can now be found in the care plan section)    Visit Information  Last OT Received On: 05/17/13 History of Present Illness: [name] working on a step ladder yesterday when descending off steps missed judged the last one. She had immediate onset of pain and when trying to get and put pressure on her left leg was unable due to pain and instability. No other injuries to report. Pt underwent ORIIF of left tibial plateau fracture 05/14/13                 Cognition  Cognition Arousal/Alertness: Awake/alert Behavior During Therapy: WFL for tasks assessed/performed Overall Cognitive Status: Within Functional Limits for tasks assessed    Mobility       Exercises  Other Exercises Other Exercises: provided pt. with theraband hand out. pt. able to return demo of all exercises using the pictures on the hand out.    Balance    End of Session OT - End of Session Activity Tolerance: Patient tolerated treatment well Patient left: in bed;with call bell/phone within reach;with family/visitor present       Janice Coffin , COTA/L  05/17/2013, 10:06 AM

## 2013-05-18 NOTE — Progress Notes (Addendum)
Pt  Discharged and picked up by EMS non-emergent team  to be  transported to her home. Husband with pt.

## 2014-01-15 ENCOUNTER — Telehealth: Payer: Self-pay | Admitting: Gynecology

## 2014-01-15 NOTE — Telephone Encounter (Signed)
Left message upcoming appointment has been canceled.

## 2014-01-20 ENCOUNTER — Ambulatory Visit: Payer: Self-pay | Admitting: Gynecology

## 2014-08-21 ENCOUNTER — Encounter: Payer: Self-pay | Admitting: Internal Medicine

## 2020-10-21 ENCOUNTER — Encounter: Payer: Self-pay | Admitting: Internal Medicine

## 2021-08-01 ENCOUNTER — Emergency Department (HOSPITAL_COMMUNITY)
Admission: EM | Admit: 2021-08-01 | Discharge: 2021-08-01 | Disposition: A | Discharge status: Home / Self Care | Payer: Self-pay | Attending: Emergency Medicine | Admitting: Emergency Medicine

## 2021-08-01 ENCOUNTER — Other Ambulatory Visit: Payer: Self-pay

## 2021-08-01 ENCOUNTER — Encounter (HOSPITAL_COMMUNITY): Payer: Self-pay

## 2021-08-01 ENCOUNTER — Emergency Department (HOSPITAL_COMMUNITY): Payer: Self-pay

## 2021-08-01 DIAGNOSIS — Z79899 Other long term (current) drug therapy: Secondary | ICD-10-CM | POA: Insufficient documentation

## 2021-08-01 DIAGNOSIS — I10 Essential (primary) hypertension: Secondary | ICD-10-CM | POA: Insufficient documentation

## 2021-08-01 DIAGNOSIS — R42 Dizziness and giddiness: Secondary | ICD-10-CM | POA: Insufficient documentation

## 2021-08-01 DIAGNOSIS — R7989 Other specified abnormal findings of blood chemistry: Secondary | ICD-10-CM | POA: Insufficient documentation

## 2021-08-01 DIAGNOSIS — K5792 Diverticulitis of intestine, part unspecified, without perforation or abscess without bleeding: Secondary | ICD-10-CM | POA: Insufficient documentation

## 2021-08-01 DIAGNOSIS — K921 Melena: Secondary | ICD-10-CM | POA: Insufficient documentation

## 2021-08-01 DIAGNOSIS — K5900 Constipation, unspecified: Secondary | ICD-10-CM | POA: Insufficient documentation

## 2021-08-01 LAB — COMPREHENSIVE METABOLIC PANEL
ALT: 12 U/L (ref 0–44)
AST: 25 U/L (ref 15–41)
Albumin: 2.9 g/dL — ABNORMAL LOW (ref 3.5–5.0)
Alkaline Phosphatase: 71 U/L (ref 38–126)
Anion gap: 11 (ref 5–15)
BUN: 34 mg/dL — ABNORMAL HIGH (ref 6–20)
CO2: 23 mmol/L (ref 22–32)
Calcium: 9.3 mg/dL (ref 8.9–10.3)
Chloride: 102 mmol/L (ref 98–111)
Creatinine, Ser: 3.9 mg/dL — ABNORMAL HIGH (ref 0.44–1.00)
GFR, Estimated: 13 mL/min — ABNORMAL LOW (ref >60)
Glucose, Bld: 105 mg/dL — ABNORMAL HIGH (ref 70–99)
Potassium: 3.3 mmol/L — ABNORMAL LOW (ref 3.5–5.1)
Sodium: 136 mmol/L (ref 135–145)
Total Bilirubin: 0.6 mg/dL (ref 0.3–1.2)
Total Protein: 8.4 g/dL — ABNORMAL HIGH (ref 6.5–8.1)

## 2021-08-01 LAB — TYPE AND SCREEN
ABO/RH(D): O POS
Antibody Screen: NEGATIVE

## 2021-08-01 LAB — CBC WITH DIFFERENTIAL/PLATELET
Abs Immature Granulocytes: 0.15 10*3/uL — ABNORMAL HIGH (ref 0.00–0.07)
Basophils Absolute: 0 10*3/uL (ref 0.0–0.1)
Basophils Relative: 0 %
Eosinophils Absolute: 0 10*3/uL (ref 0.0–0.5)
Eosinophils Relative: 1 %
HCT: 35.5 % — ABNORMAL LOW (ref 36.0–46.0)
Hemoglobin: 12 g/dL (ref 12.0–15.0)
Immature Granulocytes: 2 %
Lymphocytes Relative: 12 %
Lymphs Abs: 0.8 10*3/uL (ref 0.7–4.0)
MCH: 30.5 pg (ref 26.0–34.0)
MCHC: 33.8 g/dL (ref 30.0–36.0)
MCV: 90.3 fL (ref 80.0–100.0)
Monocytes Absolute: 0.4 10*3/uL (ref 0.1–1.0)
Monocytes Relative: 5 %
Neutro Abs: 5.8 10*3/uL (ref 1.7–7.7)
Neutrophils Relative %: 80 %
Platelets: 297 10*3/uL (ref 150–400)
RBC: 3.93 MIL/uL (ref 3.87–5.11)
RDW: 16.2 % — ABNORMAL HIGH (ref 11.5–15.5)
WBC: 7.2 10*3/uL (ref 4.0–10.5)
nRBC: 0 % (ref 0.0–0.2)

## 2021-08-01 LAB — LIPASE, BLOOD: Lipase: 78 U/L — ABNORMAL HIGH (ref 11–51)

## 2021-08-01 LAB — PROTIME-INR
INR: 1.2 (ref 0.8–1.2)
Prothrombin Time: 15.1 seconds (ref 11.4–15.2)

## 2021-08-01 MED ORDER — AMOXICILLIN-POT CLAVULANATE 875-125 MG PO TABS: 1.0000 | ORAL_TABLET | Freq: Two times a day (BID) | ORAL | 0 refills | Status: AC

## 2021-08-01 MED ORDER — DICYCLOMINE HCL 20 MG PO TABS: 20.0000 mg | ORAL_TABLET | Freq: Two times a day (BID) | ORAL | 0 refills | Status: DC

## 2021-08-01 MED ORDER — AMOXICILLIN-POT CLAVULANATE 875-125 MG PO TABS
1.0000 | ORAL_TABLET | Freq: Once | ORAL | Status: AC
  Administered 2021-08-01: 1 via ORAL
  Filled 2021-08-01: qty 1

## 2021-08-01 MED ORDER — PANTOPRAZOLE SODIUM 40 MG IV SOLR: 40.0000 mg | Freq: Once | INTRAVENOUS | Status: DC

## 2021-08-01 NOTE — Discharge Instructions (Signed)
Use 6 capfuls of miralax in 32 oz of  clear liquid- juice or gatorade. ?Take your antiobiotic as directed. ?Get help right away if: ?Your pain gets worse. ?Your symptoms do not get better with treatment. ?Your symptoms suddenly get worse. ?You have a fever. ?You vomit more than one time. ?You have stools that are bloody, black, or tarry. ?

## 2021-08-01 NOTE — ED Provider Notes (Signed)
?[place] DEPT ?Provider Note ? ? ?CSN: [id number] ?Arrival date & time: 08/01/21  1510 ? ?  ? ?History ? ?Chief Complaint  ?Patient presents with  ? Hematochezia  ? Dizziness  ? ? ?Meagan Harmon is a 61 y.o. female.  Who presents emergency department with a chief complaint of abdominal pain.  Patient was recently admitted and discharged this past Monday at [facility] in [place] after upper GI bleed due to large NSAID induced gastric ulcers.  I reviewed outside records which showed that she was noted to be hyperglycemic, have acute renal failure, uncontrolled hypertension elevated troponins and abnormal breast tissue.  Patient received 3 blood transfusions.  She was discharged on NSAIDs, Carafate, instructed to avoid NSAIDs, placed on amlodipine and carvedilol and instructed to follow-up with gastroenterology.  Patient reports that she has not made a bowel movement since Tuesday, had 1 at 2:00 today which was hard and black.  She denies weakness, racing heart, shortness of breath or other signs of anemia. ? ? ?Dizziness ? ?  ? ?Home Medications ?Prior to Admission medications   ?Medication Sig Start Date End Date Taking? Authorizing Provider  ?amoxicillin-clavulanate (AUGMENTIN) 875-125 MG tablet Take 1 tablet by mouth 2 (two) times daily for 7 days. 08/01/21 08/08/21 Yes Jyl [place], PA-C  ?dicyclomine (BENTYL) 20 MG tablet Take 1 tablet (20 mg total) by mouth 2 (two) times daily. 08/01/21  Yes Margarita Mail, PA-C  ?Azelastine HCl (ASTEPRO) 0.15 % SOLN Place 2 sprays into both nostrils daily.    [provider]  ?BIOTIN PO Take 1 tablet by mouth daily.    [provider]  ?buPROPion (WELLBUTRIN) 100 MG tablet Take 100 mg by mouth 2 (two) times daily.    [provider]  ?cyanocobalamin (,VITAMIN B-12,) 1000 MCG/ML injection Inject 1,000 mcg into the muscle daily.    [provider]  ?docusate sodium 100 MG CAPS Take 100 mg by mouth 2  (two) times daily. 05/16/13   Ainsley Spinner, PA-C  ?enoxaparin (LOVENOX) 40 MG/0.4ML injection Inject 0.4 mLs (40 mg total) into the skin daily. 05/16/13   Ainsley Spinner, PA-C  ?levocetirizine (XYZAL) 5 MG tablet Take 5 mg by mouth at bedtime.    [provider]  ?lisinopril (PRINIVIL,ZESTRIL) 10 MG tablet Take 10 mg by mouth daily.    [provider]  ?methocarbamol (ROBAXIN) 500 MG tablet Take 1-2 tablets (500-1,000 mg total) by mouth every 6 (six) hours as needed for muscle spasms. 05/16/13   Ainsley Spinner, PA-C  ?Misc Natural Products (NARCOSOFT HERBAL LAX) CAPS Take 2-3 capsules by mouth 2 (two) times daily. Takes 2 capsules in the morning and 3 capsules at night    [provider]  ?oxyCODONE (OXY IR/ROXICODONE) 5 MG immediate release tablet Take 1-3 tablets (5-15 mg total) by mouth every 3 (three) hours as needed for breakthrough pain. 05/16/13   Ainsley Spinner, PA-C  ?oxyCODONE-acetaminophen (PERCOCET/ROXICET) 5-325 MG per tablet Take 1-2 tablets by mouth every 6 (six) hours as needed for severe pain. 05/16/13   Ainsley Spinner, PA-C  ?RABEprazole (ACIPHEX) 20 MG tablet Take 20 mg by mouth every morning.     [provider]  ?sertraline (ZOLOFT) 100 MG tablet Take 100 mg by mouth 2 (two) times daily.    [provider]  ?VITAMIN K PO Take 1 tablet by mouth daily.    [provider]  ?   ? ?Allergies    ?Sulfonamide derivatives   ? ?Review of  Systems   ?Review of Systems  ?Neurological:  Positive for dizziness.  ? ?Physical Exam ?Updated Vital Signs ?BP 140/83   Pulse 78   Temp 98.1 ?F (36.7 ?C) (Oral)   Resp 15   Ht 5\' 3"  (1.6 m)   Wt 52.2 kg   SpO2 99%   BMI 20.37 kg/m?  ?Physical Exam ?Vitals and nursing note reviewed.  ?Constitutional:   ?   General: She is not in acute distress. ?   Appearance: She is well-developed. She is not diaphoretic.  ?HENT:  ?   Head: Normocephalic and atraumatic.  ?   Right Ear: External ear normal.  ?   Left Ear: External ear normal.   ?   Nose: Nose normal.  ?   Mouth/Throat:  ?   Mouth: Mucous membranes are moist.  ?Eyes:  ?   General: No scleral icterus. ?   Conjunctiva/sclera: Conjunctivae normal.  ?Cardiovascular:  ?   Rate and Rhythm: Normal rate and regular rhythm.  ?   Heart sounds: Normal heart sounds. No murmur heard. ?  No friction rub. No gallop.  ?Pulmonary:  ?   Effort: Pulmonary effort is normal. No respiratory distress.  ?   Breath sounds: Normal breath sounds.  ?Abdominal:  ?   General: Bowel sounds are normal. There is no distension.  ?   Palpations: Abdomen is soft. There is no mass.  ?   Tenderness: There is abdominal tenderness in the right lower quadrant, suprapubic area and left lower quadrant. There is no guarding.  ?Musculoskeletal:  ?   Cervical back: Normal range of motion.  ?Skin: ?   General: Skin is warm and dry.  ?Neurological:  ?   Mental Status: She is alert and oriented to person, place, and time.  ?Psychiatric:     ?   Behavior: Behavior normal.  ? ? ?ED Results / Procedures / Treatments   ?Labs ?(all labs ordered are listed, but only abnormal results are displayed) ?Labs Reviewed  ?COMPREHENSIVE METABOLIC PANEL - Abnormal; Notable for the following components:  ?    Result Value  ? Potassium 3.3 (*)   ? Glucose, Bld 105 (*)   ? BUN 34 (*)   ? Creatinine, Ser 3.90 (*)   ? Total Protein 8.4 (*)   ? Albumin 2.9 (*)   ? GFR, Estimated 13 (*)   ? All other components within normal limits  ?CBC WITH DIFFERENTIAL/PLATELET - Abnormal; Notable for the following components:  ? HCT 35.5 (*)   ? RDW 16.2 (*)   ? Abs Immature Granulocytes 0.15 (*)   ? All other components within normal limits  ?LIPASE, BLOOD - Abnormal; Notable for the following components:  ? Lipase 78 (*)   ? All other components within normal limits  ?PROTIME-INR  ?TYPE AND SCREEN  ? ? ?EKG ?EKG Interpretation ? ?Date/Time:  Sunday Aug 01 2021 15:45:53 EDT ?Ventricular Rate:  78 ?PR Interval:  131 ?QRS Duration: 87 ?QT Interval:  367 ?QTC  Calculation: 418 ?R Axis:   32 ?Text Interpretation: Sinus rhythm Nonspecific T abnormalities, lateral leads Confirmed by Lacretia Leigh ([id number]) on 08/01/2021 4:55:51 PM ? ?Radiology ?CT ABDOMEN PELVIS WO CONTRAST ? ?Result Date: 08/01/2021 ?CLINICAL DATA:  Abdominal pain. Black colored stool since being discharged from the hospital 1 week ago. Syncopal episodes. EXAM: CT ABDOMEN AND PELVIS WITHOUT CONTRAST TECHNIQUE: Multidetector CT imaging of the abdomen and pelvis was performed following the standard protocol without IV contrast. RADIATION DOSE REDUCTION: This exam  was performed according to the departmental dose-optimization program which includes automated exposure control, adjustment of the mA and/or kV according to patient size and/or use of iterative reconstruction technique. COMPARISON:  12/23/2009. FINDINGS: Lower chest: Clear lung bases. Hepatobiliary: No focal liver abnormality is seen. No gallstones, gallbladder wall thickening, or biliary dilatation. Pancreas: Unremarkable. No pancreatic ductal dilatation or surrounding inflammatory changes. Spleen: Normal in size without focal abnormality. Adrenals/Urinary Tract: No adrenal masses. Kidneys normal in size and position. Bilateral intrarenal stones. No masses. No hydronephrosis. Ureters incompletely visualized, but normal in overall course and caliber. No evidence of a ureteral stone. Normal bladder. Stomach/Bowel: Colon with moderately increased stool. Right colon mildly distended, maximum 6 cm in diameter. Multiple left colon diverticula. Mild stranding adjacent to the mid to distal sigmoid colon. Possible mild diverticulitis. No extraluminal air or evidence of an abscess. This portion of the sigmoid colon is mostly decompressed. Possible colonic narrowing in the mid sigmoid colon, but no evidence of a mass. Normal stomach. Small bowel normal in caliber with no wall thickening or inflammation. Vascular/Lymphatic: Aortic atherosclerosis. No enlarged  lymph nodes. Reproductive: Status post hysterectomy. No adnexal masses. Other: No hernia.  Trace ascites. Musculoskeletal: No fracture or acute finding. No bone lesion. Lumbar levoscoliosis and degenerative changes. IMPRESSION: 1.

## 2021-08-01 NOTE — ED Triage Notes (Addendum)
Pt c/o black colored stool that started since being discharged from hospital one week ago. Pt reports that she was having frequent syncopal episodes then while visiting her son in [place] and was admitted for GI bleed that required surgery. Reports that she received three units of PRBCs during that admission. Pt endorses dizziness today, lower abdominal pain, denies N/V/D. Pt is not anticoagulated. States that prior to admission that she took three motrins in the morning and five at night daily for years. No longer taking these meds.  ?

## 2021-08-23 DIAGNOSIS — N179 Acute kidney failure, unspecified: Secondary | ICD-10-CM | POA: Diagnosis not present

## 2021-08-23 DIAGNOSIS — D649 Anemia, unspecified: Secondary | ICD-10-CM | POA: Diagnosis not present

## 2021-09-06 DIAGNOSIS — D520 Dietary folate deficiency anemia: Secondary | ICD-10-CM | POA: Diagnosis not present

## 2021-09-06 DIAGNOSIS — N189 Chronic kidney disease, unspecified: Secondary | ICD-10-CM | POA: Diagnosis not present

## 2021-09-06 DIAGNOSIS — R309 Painful micturition, unspecified: Secondary | ICD-10-CM | POA: Diagnosis not present

## 2021-09-06 DIAGNOSIS — R809 Proteinuria, unspecified: Secondary | ICD-10-CM | POA: Diagnosis not present

## 2021-09-06 DIAGNOSIS — D649 Anemia, unspecified: Secondary | ICD-10-CM | POA: Diagnosis not present

## 2021-09-13 DIAGNOSIS — K253 Acute gastric ulcer without hemorrhage or perforation: Secondary | ICD-10-CM | POA: Diagnosis not present

## 2021-09-13 DIAGNOSIS — K2970 Gastritis, unspecified, without bleeding: Secondary | ICD-10-CM | POA: Diagnosis not present

## 2021-09-13 DIAGNOSIS — K319 Disease of stomach and duodenum, unspecified: Secondary | ICD-10-CM | POA: Diagnosis not present

## 2021-09-13 DIAGNOSIS — D509 Iron deficiency anemia, unspecified: Secondary | ICD-10-CM | POA: Diagnosis not present

## 2021-09-13 DIAGNOSIS — K5730 Diverticulosis of large intestine without perforation or abscess without bleeding: Secondary | ICD-10-CM | POA: Diagnosis not present

## 2021-09-13 DIAGNOSIS — K259 Gastric ulcer, unspecified as acute or chronic, without hemorrhage or perforation: Secondary | ICD-10-CM | POA: Diagnosis not present

## 2021-09-13 DIAGNOSIS — K648 Other hemorrhoids: Secondary | ICD-10-CM | POA: Diagnosis not present

## 2021-09-15 DIAGNOSIS — K319 Disease of stomach and duodenum, unspecified: Secondary | ICD-10-CM | POA: Diagnosis not present

## 2021-09-20 DIAGNOSIS — N185 Chronic kidney disease, stage 5: Secondary | ICD-10-CM | POA: Diagnosis not present

## 2021-09-20 DIAGNOSIS — R809 Proteinuria, unspecified: Secondary | ICD-10-CM | POA: Diagnosis not present

## 2021-09-20 DIAGNOSIS — R801 Persistent proteinuria, unspecified: Secondary | ICD-10-CM | POA: Diagnosis not present

## 2021-09-20 DIAGNOSIS — N186 End stage renal disease: Secondary | ICD-10-CM | POA: Diagnosis not present

## 2021-09-26 DIAGNOSIS — R109 Unspecified abdominal pain: Secondary | ICD-10-CM | POA: Diagnosis not present

## 2021-09-26 DIAGNOSIS — R110 Nausea: Secondary | ICD-10-CM | POA: Diagnosis not present

## 2021-10-11 DIAGNOSIS — K254 Chronic or unspecified gastric ulcer with hemorrhage: Secondary | ICD-10-CM | POA: Diagnosis not present

## 2021-10-11 DIAGNOSIS — N184 Chronic kidney disease, stage 4 (severe): Secondary | ICD-10-CM | POA: Diagnosis not present

## 2021-10-11 DIAGNOSIS — D649 Anemia, unspecified: Secondary | ICD-10-CM | POA: Diagnosis not present

## 2021-10-11 DIAGNOSIS — K5900 Constipation, unspecified: Secondary | ICD-10-CM | POA: Diagnosis not present

## 2021-11-29 DIAGNOSIS — N184 Chronic kidney disease, stage 4 (severe): Secondary | ICD-10-CM | POA: Diagnosis not present

## 2021-11-29 DIAGNOSIS — D509 Iron deficiency anemia, unspecified: Secondary | ICD-10-CM | POA: Diagnosis not present

## 2021-11-29 DIAGNOSIS — D649 Anemia, unspecified: Secondary | ICD-10-CM | POA: Diagnosis not present

## 2021-12-08 ENCOUNTER — Telehealth: Payer: Self-pay | Admitting: Hematology and Oncology

## 2021-12-08 NOTE — Telephone Encounter (Signed)
Scheduled appt per 9/15 referral. Pt is aware of appt date and time. Pt is aware to arrive 15 mins prior to appt time and to bring and updated insurance card. Pt is aware of appt location.   

## 2021-12-27 ENCOUNTER — Inpatient Hospital Stay: Payer: 59

## 2021-12-27 ENCOUNTER — Other Ambulatory Visit: Payer: Self-pay

## 2021-12-27 ENCOUNTER — Encounter: Payer: Self-pay | Admitting: Hematology and Oncology

## 2021-12-27 ENCOUNTER — Inpatient Hospital Stay: Payer: 59 | Attending: Hematology and Oncology | Admitting: Hematology and Oncology

## 2021-12-27 VITALS — BP 140/70 | HR 71 | Temp 99.0°F | Resp 18 | Ht 63.0 in | Wt 112.8 lb

## 2021-12-27 DIAGNOSIS — D72819 Decreased white blood cell count, unspecified: Secondary | ICD-10-CM

## 2021-12-27 DIAGNOSIS — D500 Iron deficiency anemia secondary to blood loss (chronic): Secondary | ICD-10-CM | POA: Diagnosis not present

## 2021-12-27 DIAGNOSIS — M7918 Myalgia, other site: Secondary | ICD-10-CM | POA: Insufficient documentation

## 2021-12-27 DIAGNOSIS — E538 Deficiency of other specified B group vitamins: Secondary | ICD-10-CM | POA: Diagnosis not present

## 2021-12-27 DIAGNOSIS — D509 Iron deficiency anemia, unspecified: Secondary | ICD-10-CM | POA: Insufficient documentation

## 2021-12-27 DIAGNOSIS — G8929 Other chronic pain: Secondary | ICD-10-CM | POA: Insufficient documentation

## 2021-12-27 DIAGNOSIS — D631 Anemia in chronic kidney disease: Secondary | ICD-10-CM

## 2021-12-27 DIAGNOSIS — N184 Chronic kidney disease, stage 4 (severe): Secondary | ICD-10-CM

## 2021-12-27 DIAGNOSIS — R5383 Other fatigue: Secondary | ICD-10-CM

## 2021-12-27 LAB — CBC WITH DIFFERENTIAL (CANCER CENTER ONLY)
Abs Immature Granulocytes: 0.03 10*3/uL (ref 0.00–0.07)
Basophils Absolute: 0 10*3/uL (ref 0.0–0.1)
Basophils Relative: 0 %
Eosinophils Absolute: 0.1 10*3/uL (ref 0.0–0.5)
Eosinophils Relative: 2 %
HCT: 30.7 % — ABNORMAL LOW (ref 36.0–46.0)
Hemoglobin: 10.1 g/dL — ABNORMAL LOW (ref 12.0–15.0)
Immature Granulocytes: 1 %
Lymphocytes Relative: 32 %
Lymphs Abs: 1.5 10*3/uL (ref 0.7–4.0)
MCH: 32.5 pg (ref 26.0–34.0)
MCHC: 32.9 g/dL (ref 30.0–36.0)
MCV: 98.7 fL (ref 80.0–100.0)
Monocytes Absolute: 0.3 10*3/uL (ref 0.1–1.0)
Monocytes Relative: 7 %
Neutro Abs: 2.6 10*3/uL (ref 1.7–7.7)
Neutrophils Relative %: 58 %
Platelet Count: 240 10*3/uL (ref 150–400)
RBC: 3.11 MIL/uL — ABNORMAL LOW (ref 3.87–5.11)
RDW: 12.8 % (ref 11.5–15.5)
WBC Count: 4.5 10*3/uL (ref 4.0–10.5)
nRBC: 0 % (ref 0.0–0.2)

## 2021-12-27 LAB — CMP (CANCER CENTER ONLY)
ALT: 39 U/L (ref 0–44)
AST: 61 U/L — ABNORMAL HIGH (ref 15–41)
Albumin: 4 g/dL (ref 3.5–5.0)
Alkaline Phosphatase: 174 U/L — ABNORMAL HIGH (ref 38–126)
Anion gap: 8 (ref 5–15)
BUN: 18 mg/dL (ref 6–20)
CO2: 22 mmol/L (ref 22–32)
Calcium: 9.2 mg/dL (ref 8.9–10.3)
Chloride: 106 mmol/L (ref 98–111)
Creatinine: 2.1 mg/dL — ABNORMAL HIGH (ref 0.44–1.00)
GFR, Estimated: 26 mL/min — ABNORMAL LOW
Glucose, Bld: 90 mg/dL (ref 70–99)
Potassium: 3.6 mmol/L (ref 3.5–5.1)
Sodium: 136 mmol/L (ref 135–145)
Total Bilirubin: 0.3 mg/dL (ref 0.3–1.2)
Total Protein: 8.8 g/dL — ABNORMAL HIGH (ref 6.5–8.1)

## 2021-12-27 LAB — RETICULOCYTES
Immature Retic Fract: 5.2 % (ref 2.3–15.9)
RBC.: 3.12 MIL/uL — ABNORMAL LOW (ref 3.87–5.11)
Retic Count, Absolute: 31.5 10*3/uL (ref 19.0–186.0)
Retic Ct Pct: 1 % (ref 0.4–3.1)

## 2021-12-27 LAB — SEDIMENTATION RATE: Sed Rate: 85 mm/hr — ABNORMAL HIGH (ref 0–22)

## 2021-12-27 LAB — VITAMIN B12: Vitamin B-12: 778 pg/mL (ref 180–914)

## 2021-12-27 LAB — FERRITIN: Ferritin: 42 ng/mL (ref 11–307)

## 2021-12-27 LAB — TSH: TSH: 1.481 u[IU]/mL (ref 0.350–4.500)

## 2021-12-27 LAB — IRON AND IRON BINDING CAPACITY (CC-WL,HP ONLY)
Iron: 72 ug/dL (ref 28–170)
Saturation Ratios: 22 % (ref 10.4–31.8)
TIBC: 329 ug/dL (ref 250–450)
UIBC: 257 ug/dL (ref 148–442)

## 2021-12-27 LAB — ABO/RH: ABO/RH(D): O POS

## 2021-12-27 NOTE — Assessment & Plan Note (Signed)
She has taken vitamin B12 injections in the past I will check a vitamin B12 level

## 2021-12-27 NOTE — Assessment & Plan Note (Signed)
The patient had come from GI bleed recently We will check iron studies

## 2021-12-27 NOTE — Progress Notes (Signed)
[facility] NOTE  Patient Care Team: Cari Caraway, MD as PCP - General (Family Medicine)  ASSESSMENT & PLAN:  Anemia in chronic kidney disease I suspect the cause of her persistent anemia is due to anemia chronic illness I will order additional work-up  Iron deficiency anemia The patient had come from GI bleed recently We will check iron studies  Leukopenia Cause is unknown We will check vitamin B12 level  Chronic kidney disease (CKD), stage IV (severe) ([place]) She has severe chronic kidney disease stage IV She is complying with nephrology follow-up and avoided NSAID We will check myeloma panel  Vitamin B12 deficiency She has taken vitamin B12 injections in the past I will check a vitamin B12 level  Other fatigue She complained of excessive fatigue I will order thyroid function test  Orders Placed This Encounter  Procedures   CMP ([facility] only)    Standing Status:   Future    Number of Occurrences:   1    Standing Expiration Date:   12/28/2022   CBC with Differential ([facility] Only)    Standing Status:   Future    Number of Occurrences:   1    Standing Expiration Date:   12/28/2022   Iron and Iron Binding Capacity ([facility] only)    Standing Status:   Future    Number of Occurrences:   1    Standing Expiration Date:   12/28/2022   Ferritin    Standing Status:   Future    Number of Occurrences:   1    Standing Expiration Date:   12/27/2022   Vitamin B12    Standing Status:   Future    Number of Occurrences:   1    Standing Expiration Date:   12/28/2022   Reticulocytes    Standing Status:   Future    Number of Occurrences:   1    Standing Expiration Date:   12/28/2022   Sedimentation rate    Standing Status:   Future    Number of Occurrences:   1    Standing Expiration Date:   12/28/2022   Kappa/lambda light chains    Standing Status:   Future    Number of Occurrences:   1    Standing Expiration Date:   12/28/2022   Multiple  Myeloma Panel (SPEP&IFE w/QIG)    Standing Status:   Future    Number of Occurrences:   1    Standing Expiration Date:   12/28/2022   TSH    Standing Status:   Future    Number of Occurrences:   1    Standing Expiration Date:   12/28/2022   Erythropoietin    Standing Status:   Future    Number of Occurrences:   1    Standing Expiration Date:   12/28/2022   ABO/Rh    Standing Status:   Future    Number of Occurrences:   1    Standing Expiration Date:   12/28/2022    All questions were answered. The patient knows to call the clinic with any problems, questions or concerns.  The total time spent in the appointment was 60 minutes encounter with patients including review of chart and various tests results, discussions about plan of care and coordination of care plan  Heath Lark, MD 10/9/20231:48 PM   CHIEF COMPLAINTS/PURPOSE OF CONSULTATION:  Anemia  HISTORY OF PRESENTING ILLNESS:  [place] 61 y.o. female is here because  of anemia  She was found to have abnormal CBC from recent blood work I have the opportunity to review her electronic records Around April or May of this year, she had recurrent hematemesis requiring 3 units of blood transfusion and urgent evaluation in [place]. The send took a lot of Aleve for chronic musculoskeletal pain.  That has caused significant ulcer and chronic renal failure After the incident, she had repeat blood work and evaluation here at [facility] On Aug 01, 2021, her CBC was normal with white blood cell count of 7.2, hemoglobin 12 and platelet count 297,000.  The patient had CT imaging that showed possible diverticulitis Around June of this year, she had EGD and colonoscopy.  She was found to have gastric ulcer with moderate gastritis and colonoscopy revealed diverticulosis and internal hemorrhoids.  The patient was found to have iron deficiency anemia thought to be related to Aleve causing gastric ulcer and she was placed on high-dose pantoprazole On  October 11, 2021, white blood cell count 3.4, hemoglobin 9.3 and platelet count 219 On 11/29/2021, white blood cell count was 3.4, hemoglobin 10.3 and platelet count 231.  Serum creatinine was elevated at 2.34  She denies recent chest pain on exertion, shortness of breath on minimal exertion, pre-syncopal episodes, or palpitations. She had not noticed any recent bleeding such as epistaxis, hematuria or hematochezia The patient denies over the counter NSAID ingestion. She is not on antiplatelets agents. Her last colonoscopy was done recently as above She had no prior history or diagnosis of cancer. Her age appropriate screening programs are up-to-date. She denies any pica and eats a variety of diet. 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 She continues to have chronic musculoskeletal pain She is compliant with nephrology follow-up  MEDICAL HISTORY:  Past Medical History:  Diagnosis Date   Anxiety    Depression    Diverticulosis    GERD (gastroesophageal reflux disease)    Hiatal hernia    Hyperlipidemia    Hypertension     SURGICAL HISTORY: Past Surgical History:  Procedure Laterality Date   NASAL SINUS SURGERY     ORIF TIBIA PLATEAU Left 05/14/2013   Procedure: LEFT OPEN REDUCTION INTERNAL FIXATION (ORIF) TIBIAL PLATEAU ;  Surgeon: Rozanna Box, MD;  Location: [facility];  Service: Orthopedics;  Laterality: Left;   TOTAL ABDOMINAL HYSTERECTOMY      SOCIAL HISTORY: Social History   Socioeconomic History   Marital status: Widowed    Spouse name: Not on file   Number of children: Not on file   Years of  education: Not on file   Highest education level: Not on file  Occupational History   Not on file  Tobacco Use   Smoking status: Never   Smokeless tobacco: Not on file  Substance and Sexual Activity   Alcohol use: Yes    Comment: social   Drug use: Not on file   Sexual activity: Not on file  Other Topics Concern   Not on file  Social History Narrative   Dog groomer, 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  MEDICATIONS:  Current Outpatient Medications  Medication Sig Dispense Refill   amLODipine (NORVASC) 10 MG tablet Take 10 mg by mouth daily.     azelastine (ASTELIN) 0.1 % nasal spray 2 sprays in each nostril     carvedilol (COREG) 3.125 MG tablet Take 3.125 mg by mouth 2 (two) times daily.     cetirizine (ZYRTEC ALLERGY) 10 MG tablet 1 tablet     Cholecalciferol 125 MCG (5000 UT) TABS Take 1 tablet(s) every week by oral route for 30 days.     doxazosin (CARDURA) 1 MG tablet Take 1 mg by mouth at bedtime.     fluticasone (FLONASE) 50 MCG/ACT nasal spray 1-2 sprays in each nostril     montelukast (SINGULAIR) 10 MG tablet 1 tablet     pantoprazole (PROTONIX) 20 MG tablet Take 20 mg by mouth daily.     lisinopril (PRINIVIL,ZESTRIL) 10 MG tablet Take 10 mg by mouth daily.     No current facility-administered medications for this visit.    REVIEW OF SYSTEMS:   Constitutional: Denies fevers, chills or abnormal night sweats Eyes: Denies blurriness of vision, double vision or watery eyes Ears, nose, mouth, throat, and face: Denies mucositis or sore throat Respiratory:  Denies cough, dyspnea or wheezes Cardiovascular: Denies palpitation, chest discomfort or lower extremity swelling Gastrointestinal:  Denies nausea, heartburn or change in bowel habits Skin: Denies abnormal skin rashes Lymphatics: Denies new lymphadenopathy or easy bruising Neurological:Denies numbness, tingling or new weaknesses Behavioral/Psych: Mood is stable, no new changes  All other systems were reviewed with the patient and are negative.  PHYSICAL EXAMINATION: ECOG PERFORMANCE STATUS: 1 - Symptomatic but completely ambulatory  Vitals:   12/27/21 1323  BP: (!) 140/70  Pulse: 71  Resp: 18  Temp: 99 F (37.2 C)  SpO2: 100%   Filed Weights   12/27/21 1323  Weight: 112 lb 12.8 oz (51.2 kg)    GENERAL:alert, no distress and comfortable.  She looks somewhat pale SKIN: skin color, texture, turgor are normal, no rashes or significant lesions EYES: normal, conjunctiva are pink and non-injected, sclera clear OROPHARYNX:no exudate, no erythema and lips, buccal mucosa, and tongue normal  NECK: supple, thyroid normal size, non-tender, without nodularity LYMPH:  no palpable lymphadenopathy in the cervical, axillary or inguinal LUNGS: clear to auscultation and percussion with normal breathing effort HEART: regular rate & rhythm and no murmurs and no lower extremity edema ABDOMEN:abdomen soft, non-tender and normal bowel sounds Musculoskeletal:no cyanosis of digits and no clubbing  PSYCH: alert & oriented x 3 with fluent speech NEURO: no focal motor/sensory deficits

## 2021-12-27 NOTE — Assessment & Plan Note (Signed)
I suspect the cause of her persistent anemia is due to anemia chronic illness I will order additional work-up

## 2021-12-27 NOTE — Assessment & Plan Note (Signed)
She complained of excessive fatigue I will order thyroid function test

## 2021-12-27 NOTE — Assessment & Plan Note (Signed)
Cause is unknown We will check vitamin B12 level

## 2021-12-27 NOTE — Assessment & Plan Note (Addendum)
She has severe chronic kidney disease stage IV She is complying with nephrology follow-up and avoided NSAID We will check myeloma panel

## 2021-12-28 LAB — KAPPA/LAMBDA LIGHT CHAINS
Kappa free light chain: 139.8 mg/L — ABNORMAL HIGH (ref 3.3–19.4)
Kappa, lambda light chain ratio: 2.36 — ABNORMAL HIGH (ref 0.26–1.65)
Lambda free light chains: 59.2 mg/L — ABNORMAL HIGH (ref 5.7–26.3)

## 2021-12-29 LAB — ERYTHROPOIETIN: Erythropoietin: 13.4 m[IU]/mL (ref 2.6–18.5)

## 2021-12-31 LAB — MULTIPLE MYELOMA PANEL, SERUM
Albumin SerPl Elph-Mcnc: 3.6 g/dL (ref 2.9–4.4)
Albumin/Glob SerPl: 0.9 (ref 0.7–1.7)
Alpha 1: 0.3 g/dL (ref 0.0–0.4)
Alpha2 Glob SerPl Elph-Mcnc: 1 g/dL (ref 0.4–1.0)
B-Globulin SerPl Elph-Mcnc: 1.4 g/dL — ABNORMAL HIGH (ref 0.7–1.3)
Gamma Glob SerPl Elph-Mcnc: 1.8 g/dL (ref 0.4–1.8)
Globulin, Total: 4.4 g/dL — ABNORMAL HIGH (ref 2.2–3.9)
IgA: 643 mg/dL — ABNORMAL HIGH (ref 87–352)
IgG (Immunoglobin G), Serum: 2273 mg/dL — ABNORMAL HIGH (ref 586–1602)
IgM (Immunoglobulin M), Srm: 25 mg/dL — ABNORMAL LOW (ref 26–217)
Total Protein ELP: 8 g/dL (ref 6.0–8.5)

## 2022-01-03 ENCOUNTER — Telehealth: Payer: Self-pay | Admitting: Hematology and Oncology

## 2022-01-03 ENCOUNTER — Other Ambulatory Visit: Payer: Self-pay | Admitting: Hematology and Oncology

## 2022-01-03 ENCOUNTER — Telehealth: Payer: Self-pay | Admitting: *Deleted

## 2022-01-03 DIAGNOSIS — M2550 Pain in unspecified joint: Secondary | ICD-10-CM

## 2022-01-03 NOTE — Telephone Encounter (Signed)
Faxed referral to Dr. Amil Amen at [facility] Fax confirmation received

## 2022-01-03 NOTE — Telephone Encounter (Signed)
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

## 2022-01-12 ENCOUNTER — Other Ambulatory Visit: Payer: Self-pay | Admitting: Hematology and Oncology

## 2022-02-20 DIAGNOSIS — J329 Chronic sinusitis, unspecified: Secondary | ICD-10-CM | POA: Diagnosis not present

## 2022-02-20 DIAGNOSIS — H6993 Unspecified Eustachian tube disorder, bilateral: Secondary | ICD-10-CM | POA: Diagnosis not present

## 2022-02-28 DIAGNOSIS — R700 Elevated erythrocyte sedimentation rate: Secondary | ICD-10-CM | POA: Diagnosis not present

## 2022-02-28 DIAGNOSIS — Z79899 Other long term (current) drug therapy: Secondary | ICD-10-CM | POA: Diagnosis not present

## 2022-02-28 DIAGNOSIS — M545 Low back pain, unspecified: Secondary | ICD-10-CM | POA: Diagnosis not present

## 2022-02-28 DIAGNOSIS — Z681 Body mass index (BMI) 19 or less, adult: Secondary | ICD-10-CM | POA: Diagnosis not present

## 2022-02-28 DIAGNOSIS — M4000 Postural kyphosis, site unspecified: Secondary | ICD-10-CM | POA: Diagnosis not present

## 2022-03-28 DIAGNOSIS — D631 Anemia in chronic kidney disease: Secondary | ICD-10-CM | POA: Diagnosis not present

## 2022-03-28 DIAGNOSIS — I129 Hypertensive chronic kidney disease with stage 1 through stage 4 chronic kidney disease, or unspecified chronic kidney disease: Secondary | ICD-10-CM | POA: Diagnosis not present

## 2022-03-28 DIAGNOSIS — N184 Chronic kidney disease, stage 4 (severe): Secondary | ICD-10-CM | POA: Diagnosis not present

## 2022-03-28 DIAGNOSIS — K922 Gastrointestinal hemorrhage, unspecified: Secondary | ICD-10-CM | POA: Diagnosis not present

## 2022-03-28 DIAGNOSIS — E876 Hypokalemia: Secondary | ICD-10-CM | POA: Diagnosis not present

## 2022-03-29 ENCOUNTER — Other Ambulatory Visit: Payer: Self-pay | Admitting: Internal Medicine

## 2022-03-29 DIAGNOSIS — N184 Chronic kidney disease, stage 4 (severe): Secondary | ICD-10-CM

## 2022-04-03 DIAGNOSIS — J329 Chronic sinusitis, unspecified: Secondary | ICD-10-CM | POA: Diagnosis not present

## 2022-04-03 DIAGNOSIS — R0981 Nasal congestion: Secondary | ICD-10-CM | POA: Diagnosis not present

## 2022-04-25 DIAGNOSIS — N184 Chronic kidney disease, stage 4 (severe): Secondary | ICD-10-CM | POA: Diagnosis not present

## 2022-04-25 DIAGNOSIS — I10 Essential (primary) hypertension: Secondary | ICD-10-CM | POA: Diagnosis not present

## 2022-04-25 DIAGNOSIS — G479 Sleep disorder, unspecified: Secondary | ICD-10-CM | POA: Diagnosis not present

## 2022-04-25 DIAGNOSIS — D509 Iron deficiency anemia, unspecified: Secondary | ICD-10-CM | POA: Diagnosis not present

## 2022-04-25 DIAGNOSIS — K254 Chronic or unspecified gastric ulcer with hemorrhage: Secondary | ICD-10-CM | POA: Diagnosis not present

## 2022-04-25 DIAGNOSIS — J309 Allergic rhinitis, unspecified: Secondary | ICD-10-CM | POA: Diagnosis not present

## 2022-04-25 DIAGNOSIS — Z681 Body mass index (BMI) 19 or less, adult: Secondary | ICD-10-CM | POA: Diagnosis not present

## 2022-04-25 DIAGNOSIS — D649 Anemia, unspecified: Secondary | ICD-10-CM | POA: Diagnosis not present

## 2022-04-25 DIAGNOSIS — E2839 Other primary ovarian failure: Secondary | ICD-10-CM | POA: Diagnosis not present

## 2022-04-25 DIAGNOSIS — Z1231 Encounter for screening mammogram for malignant neoplasm of breast: Secondary | ICD-10-CM | POA: Diagnosis not present

## 2022-05-02 ENCOUNTER — Other Ambulatory Visit: Payer: 59

## 2022-05-16 DIAGNOSIS — Z1231 Encounter for screening mammogram for malignant neoplasm of breast: Secondary | ICD-10-CM | POA: Diagnosis not present

## 2022-05-16 DIAGNOSIS — R808 Other proteinuria: Secondary | ICD-10-CM | POA: Diagnosis not present

## 2022-05-16 DIAGNOSIS — D649 Anemia, unspecified: Secondary | ICD-10-CM | POA: Diagnosis not present

## 2022-05-16 DIAGNOSIS — E039 Hypothyroidism, unspecified: Secondary | ICD-10-CM | POA: Diagnosis not present

## 2022-05-16 DIAGNOSIS — N183 Chronic kidney disease, stage 3 unspecified: Secondary | ICD-10-CM | POA: Diagnosis not present

## 2022-05-16 DIAGNOSIS — M810 Age-related osteoporosis without current pathological fracture: Secondary | ICD-10-CM | POA: Diagnosis not present

## 2022-05-16 DIAGNOSIS — Z780 Asymptomatic menopausal state: Secondary | ICD-10-CM | POA: Diagnosis not present

## 2022-08-04 DIAGNOSIS — B029 Zoster without complications: Secondary | ICD-10-CM | POA: Diagnosis not present

## 2022-08-08 DIAGNOSIS — D649 Anemia, unspecified: Secondary | ICD-10-CM | POA: Diagnosis not present

## 2022-08-08 DIAGNOSIS — N183 Chronic kidney disease, stage 3 unspecified: Secondary | ICD-10-CM | POA: Diagnosis not present

## 2022-09-09 ENCOUNTER — Other Ambulatory Visit: Payer: Self-pay | Admitting: Hematology and Oncology

## 2022-09-09 ENCOUNTER — Telehealth: Payer: Self-pay

## 2022-09-09 DIAGNOSIS — D500 Iron deficiency anemia secondary to blood loss (chronic): Secondary | ICD-10-CM

## 2022-09-09 DIAGNOSIS — N184 Chronic kidney disease, stage 4 (severe): Secondary | ICD-10-CM

## 2022-09-09 DIAGNOSIS — D72819 Decreased white blood cell count, unspecified: Secondary | ICD-10-CM

## 2022-09-09 NOTE — Telephone Encounter (Signed)
Called and left a message asking her to call the office back regarding 6/24 appt. She recently had labs a month ago and they look good. We can delay her appt for 3 months with labs. Or she can keep appt with Dr. Bertis Ruddy as scheduled. Lab appt canceled.

## 2022-09-12 ENCOUNTER — Inpatient Hospital Stay: Payer: 59 | Attending: Hematology and Oncology | Admitting: Hematology and Oncology

## 2022-09-12 ENCOUNTER — Other Ambulatory Visit: Payer: Self-pay

## 2022-09-12 ENCOUNTER — Other Ambulatory Visit: Payer: 59

## 2022-09-12 ENCOUNTER — Encounter: Payer: Self-pay | Admitting: Hematology and Oncology

## 2022-09-12 VITALS — BP 131/68 | HR 64 | Temp 99.9°F | Resp 18 | Ht 63.0 in | Wt 112.6 lb

## 2022-09-12 DIAGNOSIS — M2550 Pain in unspecified joint: Secondary | ICD-10-CM

## 2022-09-12 DIAGNOSIS — N184 Chronic kidney disease, stage 4 (severe): Secondary | ICD-10-CM

## 2022-09-12 DIAGNOSIS — D509 Iron deficiency anemia, unspecified: Secondary | ICD-10-CM | POA: Diagnosis not present

## 2022-09-12 DIAGNOSIS — D500 Iron deficiency anemia secondary to blood loss (chronic): Secondary | ICD-10-CM | POA: Diagnosis not present

## 2022-09-12 DIAGNOSIS — D631 Anemia in chronic kidney disease: Secondary | ICD-10-CM | POA: Diagnosis not present

## 2022-09-12 DIAGNOSIS — Z79899 Other long term (current) drug therapy: Secondary | ICD-10-CM | POA: Insufficient documentation

## 2022-09-12 DIAGNOSIS — D72819 Decreased white blood cell count, unspecified: Secondary | ICD-10-CM

## 2022-09-12 NOTE — Progress Notes (Signed)
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  Leukopenia Cause is unknown Previous B12 level was normal I will check ANA level to screen for autoimmune condition  Orders Placed This Encounter  Procedures   Multiple Myeloma Panel (SPEP&IFE w/QIG)    Standing Status:   Future    Standing Expiration Date:   09/12/2023   Kappa/lambda light chains    Standing Status:   Future    Standing Expiration Date:   09/12/2023   ANA, IFA (with reflex)    Standing Status:   Future    Standing Expiration Date:   09/12/2023    The total time spent in the appointment was 25 minutes encounter with patients including review of chart and various tests results, discussions about plan of care and coordination of care plan   All questions were answered. The patient knows to call the clinic with any problems, questions or concerns. No barriers to learning was detected.    Artis Delay, MD 6/24/20243:20 PM  INTERVAL HISTORY: Meagan Harmon 62 y.o. female returns for further evaluation for history of chronic pancytopenia in the setting of anemia of chronic kidney disease and borderline iron deficiency I cancel her blood work today because she just had blood work done 3 weeks ago We reviewed test results She felt fine except for some lower back pain intermittently She had recent shingles but that has resolved She still have neuropathic pain but declined prescription medication for that  SUMMARY OF HEMATOLOGIC HISTORY:  She was found to have abnormal CBC from  recent blood work I have the opportunity to review her electronic records Around April or May of this year, she had recurrent hematemesis requiring 3 units of blood transfusion and urgent evaluation in [place]. The send took a lot of Aleve for chronic musculoskeletal pain.  That has caused significant ulcer and chronic renal failure After the incident, she had repeat blood work and evaluation here at [facility] On Aug 01, 2021, her CBC was normal with white blood cell count of 7.2, hemoglobin 12 and platelet count 297,000.  The patient had CT imaging that showed possible diverticulitis Around June of this year, she had EGD and colonoscopy.  She was found to have gastric ulcer with moderate gastritis and colonoscopy revealed diverticulosis and internal hemorrhoids.  The patient was found to have iron deficiency anemia thought to be related to Aleve causing gastric ulcer and she was placed on high-dose pantoprazole On October 11, 2021, white blood cell count 3.4, hemoglobin 9.3 and platelet count 219 On 11/29/2021, white blood cell count was 3.4, hemoglobin 10.3 and platelet count 231.  Serum creatinine was elevated at 2.34  She denies recent chest pain on exertion, shortness of breath on minimal exertion, pre-syncopal episodes, or palpitations. She had not noticed any recent bleeding such as epistaxis, hematuria or hematochezia The patient denies over the counter NSAID ingestion. She is not on antiplatelets agents. Her last colonoscopy was done recently as above She had no prior history or diagnosis of cancer. Her age appropriate screening programs are up-to-date. She denies any pica and  eats a variety of diet. 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  She had borderline iron deficiency.  I recommended close observation  I have reviewed the past medical history, past surgical history, social history and family history with the patient and they are unchanged from previous note.  ALLERGIES:  is allergic to sulfonamide derivatives.  MEDICATIONS:  Current Outpatient Medications  Medication Sig Dispense Refill   amLODipine (NORVASC) 10 MG tablet Take 10 mg by mouth daily.     azelastine (ASTELIN) 0.1 % nasal spray 2 sprays in each nostril     carvedilol (COREG) 3.125 MG tablet Take 3.125 mg by mouth 2 (two) times daily.     cetirizine (ZYRTEC ALLERGY) 10 MG tablet 1 tablet     Cholecalciferol 125 MCG (5000 UT) TABS Take 1 tablet(s) every week by oral route for 30 days.     doxazosin (CARDURA) 1 MG tablet Take 1 mg by mouth at bedtime.     fluticasone (FLONASE) 50 MCG/ACT nasal spray 1-2 sprays in each nostril     lisinopril (PRINIVIL,ZESTRIL) 10 MG tablet Take 10 mg by mouth daily.     montelukast (SINGULAIR) 10 MG tablet 1 tablet     pantoprazole (PROTONIX) 20 MG tablet Take 20 mg by mouth daily.     No current facility-administered medications for this visit.     REVIEW OF SYSTEMS:   Constitutional: Denies fevers, chills or night sweats Eyes: Denies blurriness of vision Ears, nose, mouth, throat, and face: Denies mucositis or sore throat Respiratory: Denies cough, dyspnea or wheezes Cardiovascular: Denies palpitation, chest discomfort or lower extremity swelling Gastrointestinal:  Denies nausea, heartburn or change in bowel habits Skin: Denies abnormal skin rashes Lymphatics: Denies new lymphadenopathy or easy  bruising Neurological:Denies numbness, tingling or new weaknesses Behavioral/Psych: Mood is stable, no new changes  All other systems were reviewed with the patient and are negative.  PHYSICAL EXAMINATION: ECOG PERFORMANCE STATUS: 1 - Symptomatic but completely ambulatory  Vitals:   09/12/22 1400  BP: 131/68  Pulse: 64  Resp: 18  Temp: 99.9 F (37.7 C)  SpO2: 100%   Filed Weights   09/12/22 1400  Weight: 112 lb 9.6 oz (51.1 kg)    GENERAL:alert, no distress and comfortable NEURO: alert & oriented x 3 with fluent speech, no focal motor/sensory deficits  LABORATORY DATA:  I have reviewed the data as listed     Component Value Date/Time   NA 136 12/27/2021 1344   K 3.6 12/27/2021 1344   CL 106 12/27/2021 1344   CO2 22 12/27/2021 1344   GLUCOSE 90 12/27/2021 1344   BUN 18 12/27/2021 1344   CREATININE 2.10 (H) 12/27/2021 1344   CALCIUM 9.2 12/27/2021 1344   PROT 8.8 (H) 12/27/2021 1344   ALBUMIN 4.0 12/27/2021 1344   AST 61 (H) 12/27/2021 1344   ALT 39 12/27/2021 1344   ALKPHOS 174 (H) 12/27/2021 1344  BILITOT 0.3 12/27/2021 1344   GFRNONAA 26 (L) 12/27/2021 1344   GFRAA >90 05/16/2013 0508    No results found for: "SPEP", "UPEP"  Lab Results  Component Value Date   WBC 4.5 12/27/2021   NEUTROABS 2.6 12/27/2021   HGB 10.1 (L) 12/27/2021   HCT 30.7 (L) 12/27/2021   MCV 98.7 12/27/2021   PLT 240 12/27/2021      Chemistry      Component Value Date/Time   NA 136 12/27/2021 1344   K 3.6 12/27/2021 1344   CL 106 12/27/2021 1344   CO2 22 12/27/2021 1344   BUN 18 12/27/2021 1344   CREATININE 2.10 (H) 12/27/2021 1344      Component Value Date/Time   CALCIUM 9.2 12/27/2021 1344   ALKPHOS 174 (H) 12/27/2021 1344   AST 61 (H) 12/27/2021 1344   ALT 39 12/27/2021 1344   BILITOT 0.3 12/27/2021 1344

## 2022-09-12 NOTE — Assessment & Plan Note (Signed)
She had history of iron deficiency anemia Her recent iron studies are adequate and her hemoglobin has improved Observe only

## 2022-09-12 NOTE — Assessment & Plan Note (Signed)
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

## 2022-09-12 NOTE — Assessment & Plan Note (Signed)
Cause is unknown Previous B12 level was normal I will check ANA level to screen for autoimmune condition

## 2022-09-17 DIAGNOSIS — J0141 Acute recurrent pansinusitis: Secondary | ICD-10-CM | POA: Diagnosis not present

## 2022-10-03 DIAGNOSIS — E559 Vitamin D deficiency, unspecified: Secondary | ICD-10-CM | POA: Diagnosis not present

## 2022-10-03 DIAGNOSIS — Z681 Body mass index (BMI) 19 or less, adult: Secondary | ICD-10-CM | POA: Diagnosis not present

## 2022-10-03 DIAGNOSIS — M810 Age-related osteoporosis without current pathological fracture: Secondary | ICD-10-CM | POA: Diagnosis not present

## 2022-10-03 DIAGNOSIS — B0229 Other postherpetic nervous system involvement: Secondary | ICD-10-CM | POA: Diagnosis not present

## 2022-11-14 DIAGNOSIS — J3089 Other allergic rhinitis: Secondary | ICD-10-CM | POA: Diagnosis not present

## 2023-01-02 DIAGNOSIS — N183 Chronic kidney disease, stage 3 unspecified: Secondary | ICD-10-CM | POA: Diagnosis not present

## 2023-01-02 DIAGNOSIS — D649 Anemia, unspecified: Secondary | ICD-10-CM | POA: Diagnosis not present

## 2023-01-09 ENCOUNTER — Inpatient Hospital Stay: Payer: 59 | Attending: Family Medicine

## 2023-01-17 ENCOUNTER — Telehealth: Payer: Self-pay

## 2023-01-17 NOTE — Telephone Encounter (Signed)
Called and left a message asking her to call the office back. She missed 10/21 lab appt. Dr. Bertis Ruddy is canceling her appt for 10/31 with her at 1200 for telephone office visit.

## 2023-01-18 ENCOUNTER — Telehealth: Payer: Self-pay | Admitting: *Deleted

## 2023-01-18 NOTE — Telephone Encounter (Signed)
Called patient. Left message asking her to call the office back to reschedule appts. She missed 10/21 lab appt. Dr. Bertis Ruddy cancelled her follow up phone appt on 10/31 at 1200 to review lab results

## 2023-01-19 ENCOUNTER — Inpatient Hospital Stay: Payer: 59 | Admitting: Hematology and Oncology

## 2023-01-30 DIAGNOSIS — E559 Vitamin D deficiency, unspecified: Secondary | ICD-10-CM | POA: Diagnosis not present

## 2023-02-01 DIAGNOSIS — R509 Fever, unspecified: Secondary | ICD-10-CM | POA: Diagnosis not present

## 2023-02-01 DIAGNOSIS — U071 COVID-19: Secondary | ICD-10-CM | POA: Diagnosis not present

## 2023-05-08 DIAGNOSIS — R808 Other proteinuria: Secondary | ICD-10-CM | POA: Diagnosis not present

## 2023-05-08 DIAGNOSIS — D649 Anemia, unspecified: Secondary | ICD-10-CM | POA: Diagnosis not present

## 2023-05-08 DIAGNOSIS — N183 Chronic kidney disease, stage 3 unspecified: Secondary | ICD-10-CM | POA: Diagnosis not present

## 2023-06-05 DIAGNOSIS — E039 Hypothyroidism, unspecified: Secondary | ICD-10-CM | POA: Diagnosis not present

## 2023-06-05 DIAGNOSIS — R809 Proteinuria, unspecified: Secondary | ICD-10-CM | POA: Diagnosis not present

## 2023-06-05 DIAGNOSIS — N183 Chronic kidney disease, stage 3 unspecified: Secondary | ICD-10-CM | POA: Diagnosis not present

## 2023-10-16 DIAGNOSIS — D509 Iron deficiency anemia, unspecified: Secondary | ICD-10-CM | POA: Diagnosis not present

## 2023-11-13 DIAGNOSIS — Z1231 Encounter for screening mammogram for malignant neoplasm of breast: Secondary | ICD-10-CM | POA: Diagnosis not present

## 2023-11-20 DIAGNOSIS — F4323 Adjustment disorder with mixed anxiety and depressed mood: Secondary | ICD-10-CM | POA: Diagnosis not present

## 2023-11-27 DIAGNOSIS — F4323 Adjustment disorder with mixed anxiety and depressed mood: Secondary | ICD-10-CM | POA: Diagnosis not present

## 2023-12-11 DIAGNOSIS — F4323 Adjustment disorder with mixed anxiety and depressed mood: Secondary | ICD-10-CM | POA: Diagnosis not present

## 2024-01-01 DIAGNOSIS — F4323 Adjustment disorder with mixed anxiety and depressed mood: Secondary | ICD-10-CM | POA: Diagnosis not present

## 2024-01-08 DIAGNOSIS — Z7682 Awaiting organ transplant status: Secondary | ICD-10-CM | POA: Diagnosis not present

## 2024-01-08 DIAGNOSIS — N186 End stage renal disease: Secondary | ICD-10-CM | POA: Diagnosis not present

## 2024-01-08 DIAGNOSIS — Z01810 Encounter for preprocedural cardiovascular examination: Secondary | ICD-10-CM | POA: Diagnosis not present

## 2024-01-08 DIAGNOSIS — Z01818 Encounter for other preprocedural examination: Secondary | ICD-10-CM | POA: Diagnosis not present

## 2024-01-08 DIAGNOSIS — R001 Bradycardia, unspecified: Secondary | ICD-10-CM | POA: Diagnosis not present

## 2024-01-11 DIAGNOSIS — R935 Abnormal findings on diagnostic imaging of other abdominal regions, including retroperitoneum: Secondary | ICD-10-CM | POA: Diagnosis not present

## 2024-01-11 DIAGNOSIS — K219 Gastro-esophageal reflux disease without esophagitis: Secondary | ICD-10-CM | POA: Diagnosis not present

## 2024-01-22 DIAGNOSIS — F4323 Adjustment disorder with mixed anxiety and depressed mood: Secondary | ICD-10-CM | POA: Diagnosis not present

## 2024-01-29 DIAGNOSIS — J3089 Other allergic rhinitis: Secondary | ICD-10-CM | POA: Diagnosis not present

## 2024-01-29 DIAGNOSIS — H1045 Other chronic allergic conjunctivitis: Secondary | ICD-10-CM | POA: Diagnosis not present

## 2024-02-05 DIAGNOSIS — K529 Noninfective gastroenteritis and colitis, unspecified: Secondary | ICD-10-CM | POA: Diagnosis not present

## 2024-02-05 DIAGNOSIS — K2970 Gastritis, unspecified, without bleeding: Secondary | ICD-10-CM | POA: Diagnosis not present

## 2024-02-05 DIAGNOSIS — K5730 Diverticulosis of large intestine without perforation or abscess without bleeding: Secondary | ICD-10-CM | POA: Diagnosis not present

## 2024-02-05 DIAGNOSIS — K6389 Other specified diseases of intestine: Secondary | ICD-10-CM | POA: Diagnosis not present

## 2024-02-05 DIAGNOSIS — K219 Gastro-esophageal reflux disease without esophagitis: Secondary | ICD-10-CM | POA: Diagnosis not present

## 2024-02-05 DIAGNOSIS — K319 Disease of stomach and duodenum, unspecified: Secondary | ICD-10-CM | POA: Diagnosis not present

## 2024-02-05 DIAGNOSIS — K2100 Gastro-esophageal reflux disease with esophagitis, without bleeding: Secondary | ICD-10-CM | POA: Diagnosis not present

## 2024-02-05 DIAGNOSIS — D125 Benign neoplasm of sigmoid colon: Secondary | ICD-10-CM | POA: Diagnosis not present

## 2024-02-05 DIAGNOSIS — R933 Abnormal findings on diagnostic imaging of other parts of digestive tract: Secondary | ICD-10-CM | POA: Diagnosis not present

## 2024-02-05 DIAGNOSIS — K449 Diaphragmatic hernia without obstruction or gangrene: Secondary | ICD-10-CM | POA: Diagnosis not present

## 2024-02-19 DIAGNOSIS — F4323 Adjustment disorder with mixed anxiety and depressed mood: Secondary | ICD-10-CM | POA: Diagnosis not present

## 2024-02-26 DIAGNOSIS — F4323 Adjustment disorder with mixed anxiety and depressed mood: Secondary | ICD-10-CM | POA: Diagnosis not present
